# Patient Record
Sex: Female | Born: 1980 | Race: White | Hispanic: No | Marital: Single | Smoking: Current every day smoker
Health system: Southern US, Community
[De-identification: ages and names within clinical notes are randomized; demographics above are authoritative.]

## PROBLEM LIST (undated history)

## (undated) DIAGNOSIS — K289 Gastrojejunal ulcer, unspecified as acute or chronic, without hemorrhage or perforation: Secondary | ICD-10-CM

## (undated) HISTORY — PX: APPENDECTOMY: SHX54

## (undated) HISTORY — PX: CHOLECYSTECTOMY: SHX55

## (undated) HISTORY — PX: EXPLORATORY LAPAROTOMY: SUR591

## (undated) HISTORY — PX: ABDOMINAL SURGERY: SHX537

## (undated) HISTORY — PX: TUBAL LIGATION: SHX77

## (undated) HISTORY — PX: GASTRIC BYPASS: SHX52

---

## 2012-02-13 ENCOUNTER — Emergency Department: Payer: Self-pay | Admitting: Emergency Medicine

## 2012-02-13 LAB — COMPREHENSIVE METABOLIC PANEL
Albumin: 4.3 g/dL (ref 3.4–5.0)
Alkaline Phosphatase: 64 U/L (ref 50–136)
BUN: 8 mg/dL (ref 7–18)
Bilirubin,Total: 0.4 mg/dL (ref 0.2–1.0)
Calcium, Total: 9.2 mg/dL (ref 8.5–10.1)
Co2: 29 mmol/L (ref 21–32)
Creatinine: 0.61 mg/dL (ref 0.60–1.30)
EGFR (African American): >60
Glucose: 88 mg/dL (ref 65–99)
Osmolality: 275 (ref 275–301)
Potassium: 3.6 mmol/L (ref 3.5–5.1)
SGOT(AST): 34 U/L (ref 15–37)
SGPT (ALT): 28 U/L (ref 12–78)
Total Protein: 7.8 g/dL (ref 6.4–8.2)

## 2012-02-13 LAB — CBC
HCT: 37.6 % (ref 35.0–47.0)
MCHC: 33.5 g/dL (ref 32.0–36.0)
MCV: 91 fL (ref 80–100)
RDW: 15.8 % — ABNORMAL HIGH (ref 11.5–14.5)
WBC: 6.2 10*3/uL (ref 3.6–11.0)

## 2012-02-13 LAB — URINALYSIS, COMPLETE
Bacteria: NONE SEEN
Bilirubin,UR: NEGATIVE
Glucose,UR: NEGATIVE mg/dL (ref 0–75)
Leukocyte Esterase: NEGATIVE
Nitrite: NEGATIVE
RBC,UR: NONE SEEN /HPF (ref 0–5)
Specific Gravity: 1.004 (ref 1.003–1.030)
Squamous Epithelial: 2

## 2012-02-13 LAB — LIPASE, BLOOD: Lipase: 166 U/L (ref 73–393)

## 2012-06-22 ENCOUNTER — Ambulatory Visit: Payer: Self-pay | Admitting: Internal Medicine

## 2012-10-27 LAB — COMPREHENSIVE METABOLIC PANEL
Alkaline Phosphatase: 65 U/L (ref 50–136)
Anion Gap: 8 (ref 7–16)
BUN: 17 mg/dL (ref 7–18)
Bilirubin,Total: 0.2 mg/dL (ref 0.2–1.0)
Co2: 25 mmol/L (ref 21–32)
EGFR (African American): >60
EGFR (Non-African Amer.): >60
Glucose: 88 mg/dL (ref 65–99)
Osmolality: 277 (ref 275–301)
Potassium: 4.2 mmol/L (ref 3.5–5.1)
Sodium: 138 mmol/L (ref 136–145)
Total Protein: 7.5 g/dL (ref 6.4–8.2)

## 2012-10-27 LAB — URINALYSIS, COMPLETE
Bacteria: NONE SEEN
Glucose,UR: NEGATIVE mg/dL (ref 0–75)
Leukocyte Esterase: NEGATIVE
Nitrite: NEGATIVE
RBC,UR: 1 /HPF (ref 0–5)
Squamous Epithelial: 20
WBC UR: 3 /HPF (ref 0–5)

## 2012-10-27 LAB — CBC
HCT: 35.2 % (ref 35.0–47.0)
HGB: 11.6 g/dL — ABNORMAL LOW (ref 12.0–16.0)
MCH: 27.8 pg (ref 26.0–34.0)
MCV: 85 fL (ref 80–100)
Platelet: 410 10*3/uL (ref 150–440)
RBC: 4.16 10*6/uL (ref 3.80–5.20)
RDW: 16.9 % — ABNORMAL HIGH (ref 11.5–14.5)
WBC: 11.1 10*3/uL — ABNORMAL HIGH (ref 3.6–11.0)

## 2012-10-27 LAB — PREGNANCY, URINE: Pregnancy Test, Urine: NEGATIVE m[IU]/mL

## 2012-10-27 LAB — LIPASE, BLOOD: Lipase: 176 U/L (ref 73–393)

## 2012-10-28 ENCOUNTER — Inpatient Hospital Stay: Payer: Self-pay | Admitting: Surgery

## 2012-10-29 LAB — CBC WITH DIFFERENTIAL/PLATELET
Basophil #: 0.1 10*3/uL (ref 0.0–0.1)
Basophil %: 0.9 %
Eosinophil #: 0.3 10*3/uL (ref 0.0–0.7)
Eosinophil %: 5 %
HGB: 9.7 g/dL — ABNORMAL LOW (ref 12.0–16.0)
Lymphocyte #: 2.2 10*3/uL (ref 1.0–3.6)
MCH: 27.5 pg (ref 26.0–34.0)
MCV: 85 fL (ref 80–100)
Monocyte #: 0.3 x10 3/mm (ref 0.2–0.9)
Monocyte %: 4.9 %
Neutrophil #: 3.7 10*3/uL (ref 1.4–6.5)
Platelet: 310 10*3/uL (ref 150–440)
RBC: 3.55 10*6/uL — ABNORMAL LOW (ref 3.80–5.20)
RDW: 16.9 % — ABNORMAL HIGH (ref 11.5–14.5)
WBC: 6.6 10*3/uL (ref 3.6–11.0)

## 2012-10-29 LAB — COMPREHENSIVE METABOLIC PANEL
Alkaline Phosphatase: 51 U/L (ref 50–136)
Anion Gap: 6 — ABNORMAL LOW (ref 7–16)
Bilirubin,Total: 0.2 mg/dL (ref 0.2–1.0)
Glucose: 88 mg/dL (ref 65–99)
Osmolality: 280 (ref 275–301)
Potassium: 4.1 mmol/L (ref 3.5–5.1)
SGOT(AST): 11 U/L — ABNORMAL LOW (ref 15–37)
SGPT (ALT): 11 U/L — ABNORMAL LOW (ref 12–78)
Sodium: 142 mmol/L (ref 136–145)
Total Protein: 6 g/dL — ABNORMAL LOW (ref 6.4–8.2)

## 2012-10-29 LAB — MAGNESIUM: Magnesium: 1.6 mg/dL — ABNORMAL LOW

## 2012-10-30 LAB — PATHOLOGY REPORT

## 2012-10-31 LAB — BASIC METABOLIC PANEL
Anion Gap: 5 — ABNORMAL LOW (ref 7–16)
BUN: 3 mg/dL — ABNORMAL LOW (ref 7–18)
Calcium, Total: 8.1 mg/dL — ABNORMAL LOW (ref 8.5–10.1)
Co2: 27 mmol/L (ref 21–32)
Creatinine: 0.6 mg/dL (ref 0.60–1.30)
EGFR (African American): >60
Potassium: 3.9 mmol/L (ref 3.5–5.1)

## 2012-10-31 LAB — CBC WITH DIFFERENTIAL/PLATELET
Basophil #: 0 10*3/uL (ref 0.0–0.1)
Eosinophil #: 0.2 10*3/uL (ref 0.0–0.7)
Eosinophil %: 2.9 %
HCT: 30.8 % — ABNORMAL LOW (ref 35.0–47.0)
Lymphocyte #: 1.1 10*3/uL (ref 1.0–3.6)
MCH: 27.8 pg (ref 26.0–34.0)
MCHC: 32.8 g/dL (ref 32.0–36.0)
Neutrophil #: 4.9 10*3/uL (ref 1.4–6.5)
Neutrophil %: 74.5 %
RBC: 3.64 10*6/uL — ABNORMAL LOW (ref 3.80–5.20)
RDW: 16.7 % — ABNORMAL HIGH (ref 11.5–14.5)
WBC: 6.5 10*3/uL (ref 3.6–11.0)

## 2012-11-03 LAB — BASIC METABOLIC PANEL
Calcium, Total: 8.5 mg/dL (ref 8.5–10.1)
Chloride: 109 mmol/L — ABNORMAL HIGH (ref 98–107)
Co2: 27 mmol/L (ref 21–32)
Creatinine: 0.51 mg/dL — ABNORMAL LOW (ref 0.60–1.30)
EGFR (Non-African Amer.): >60
Glucose: 96 mg/dL (ref 65–99)
Osmolality: 279 (ref 275–301)
Potassium: 3.2 mmol/L — ABNORMAL LOW (ref 3.5–5.1)
Sodium: 142 mmol/L (ref 136–145)

## 2012-11-03 LAB — CBC WITH DIFFERENTIAL/PLATELET
Basophil #: 0 10*3/uL (ref 0.0–0.1)
Basophil %: 0.6 %
Eosinophil #: 0.3 10*3/uL (ref 0.0–0.7)
Eosinophil %: 5.8 %
Lymphocyte #: 0.9 10*3/uL — ABNORMAL LOW (ref 1.0–3.6)
Lymphocyte %: 20.3 %
MCH: 27.7 pg (ref 26.0–34.0)
MCHC: 32.8 g/dL (ref 32.0–36.0)
MCV: 85 fL (ref 80–100)
Monocyte #: 0.4 x10 3/mm (ref 0.2–0.9)
Monocyte %: 8.9 %
Neutrophil #: 2.9 10*3/uL (ref 1.4–6.5)
Neutrophil %: 64.4 %
Platelet: 274 10*3/uL (ref 150–440)
RBC: 3.4 10*6/uL — ABNORMAL LOW (ref 3.80–5.20)
WBC: 4.4 10*3/uL (ref 3.6–11.0)

## 2012-11-07 LAB — URINALYSIS, COMPLETE
Bilirubin,UR: NEGATIVE
Ketone: NEGATIVE
Nitrite: NEGATIVE
Ph: 6 (ref 4.5–8.0)
RBC,UR: 1 /HPF (ref 0–5)
Squamous Epithelial: 2
WBC UR: 1 /HPF (ref 0–5)

## 2012-11-07 LAB — CBC WITH DIFFERENTIAL/PLATELET
Basophil #: 0 10*3/uL (ref 0.0–0.1)
Basophil %: 0.4 %
Eosinophil %: 5 %
HCT: 29.7 % — ABNORMAL LOW (ref 35.0–47.0)
Lymphocyte #: 1.5 10*3/uL (ref 1.0–3.6)
Lymphocyte %: 24.5 %
MCHC: 32.5 g/dL (ref 32.0–36.0)
Monocyte #: 0.5 x10 3/mm (ref 0.2–0.9)
Monocyte %: 8.4 %
Neutrophil #: 3.7 10*3/uL (ref 1.4–6.5)
Neutrophil %: 61.7 %
Platelet: 341 10*3/uL (ref 150–440)
RDW: 17.8 % — ABNORMAL HIGH (ref 11.5–14.5)
WBC: 6 10*3/uL (ref 3.6–11.0)

## 2012-11-07 LAB — BASIC METABOLIC PANEL
BUN: 4 mg/dL — ABNORMAL LOW (ref 7–18)
Calcium, Total: 9.2 mg/dL (ref 8.5–10.1)
Chloride: 101 mmol/L (ref 98–107)
Co2: 32 mmol/L (ref 21–32)
Creatinine: 0.56 mg/dL — ABNORMAL LOW (ref 0.60–1.30)
EGFR (African American): >60
EGFR (Non-African Amer.): >60
Glucose: 97 mg/dL (ref 65–99)
Osmolality: 271 (ref 275–301)
Potassium: 3.8 mmol/L (ref 3.5–5.1)

## 2012-11-10 LAB — CLOSTRIDIUM DIFFICILE BY PCR

## 2012-11-11 LAB — CREATININE, SERUM
EGFR (African American): >60
EGFR (Non-African Amer.): >60

## 2012-11-15 LAB — CREATININE, SERUM: Creatinine: 0.56 mg/dL — ABNORMAL LOW (ref 0.60–1.30)

## 2012-11-18 ENCOUNTER — Observation Stay: Payer: Self-pay | Admitting: Surgery

## 2012-12-16 ENCOUNTER — Ambulatory Visit: Payer: Self-pay | Admitting: Surgery

## 2012-12-21 ENCOUNTER — Other Ambulatory Visit: Payer: Self-pay | Admitting: Surgery

## 2012-12-21 LAB — COMPREHENSIVE METABOLIC PANEL
Albumin: 3.8 g/dL (ref 3.4–5.0)
Anion Gap: 6 — ABNORMAL LOW (ref 7–16)
Bilirubin,Total: 0.3 mg/dL (ref 0.2–1.0)
Chloride: 111 mmol/L — ABNORMAL HIGH (ref 98–107)
Co2: 26 mmol/L (ref 21–32)
Creatinine: 0.67 mg/dL (ref 0.60–1.30)
EGFR (African American): >60
Glucose: 84 mg/dL (ref 65–99)
Osmolality: 284 (ref 275–301)
Potassium: 3.2 mmol/L — ABNORMAL LOW (ref 3.5–5.1)
SGOT(AST): 12 U/L — ABNORMAL LOW (ref 15–37)
SGPT (ALT): 17 U/L (ref 12–78)
Sodium: 143 mmol/L (ref 136–145)
Total Protein: 6.7 g/dL (ref 6.4–8.2)

## 2012-12-21 LAB — CBC WITH DIFFERENTIAL/PLATELET
Basophil #: 0 10*3/uL (ref 0.0–0.1)
Basophil %: 0.8 %
Eosinophil #: 0.2 10*3/uL (ref 0.0–0.7)
Eosinophil %: 3.9 %
HCT: 32.2 % — ABNORMAL LOW (ref 35.0–47.0)
Lymphocyte #: 1.7 10*3/uL (ref 1.0–3.6)
Lymphocyte %: 32.8 %
MCH: 25.7 pg — ABNORMAL LOW (ref 26.0–34.0)
MCHC: 32.5 g/dL (ref 32.0–36.0)
MCV: 79 fL — ABNORMAL LOW (ref 80–100)
Monocyte #: 0.3 x10 3/mm (ref 0.2–0.9)
WBC: 5.3 10*3/uL (ref 3.6–11.0)

## 2012-12-21 LAB — PROTIME-INR: Prothrombin Time: 11.4 secs — ABNORMAL LOW (ref 11.5–14.7)

## 2013-01-04 ENCOUNTER — Emergency Department: Payer: Self-pay | Admitting: Emergency Medicine

## 2013-01-04 LAB — COMPREHENSIVE METABOLIC PANEL
Albumin: 3.3 g/dL — ABNORMAL LOW (ref 3.4–5.0)
Alkaline Phosphatase: 64 U/L (ref 50–136)
Anion Gap: 9 (ref 7–16)
BUN: 4 mg/dL — ABNORMAL LOW (ref 7–18)
Bilirubin,Total: 0.3 mg/dL (ref 0.2–1.0)
Chloride: 110 mmol/L — ABNORMAL HIGH (ref 98–107)
Creatinine: 0.66 mg/dL (ref 0.60–1.30)
EGFR (African American): >60
EGFR (Non-African Amer.): >60
SGOT(AST): 20 U/L (ref 15–37)
SGPT (ALT): 23 U/L (ref 12–78)
Sodium: 143 mmol/L (ref 136–145)
Total Protein: 6.3 g/dL — ABNORMAL LOW (ref 6.4–8.2)

## 2013-01-04 LAB — URINALYSIS, COMPLETE
Blood: NEGATIVE
Glucose,UR: NEGATIVE mg/dL (ref 0–75)
Ketone: NEGATIVE
Leukocyte Esterase: NEGATIVE
Nitrite: NEGATIVE
Ph: 6 (ref 4.5–8.0)
Specific Gravity: 1.003 (ref 1.003–1.030)
Squamous Epithelial: <1

## 2013-01-04 LAB — BASIC METABOLIC PANEL
Anion Gap: 8 (ref 7–16)
BUN: 4 mg/dL — ABNORMAL LOW (ref 7–18)
Co2: 23 mmol/L (ref 21–32)
Creatinine: 0.64 mg/dL (ref 0.60–1.30)
EGFR (African American): >60
EGFR (Non-African Amer.): >60
Glucose: 38 mg/dL — CL (ref 65–99)

## 2013-01-04 LAB — LIPASE, BLOOD: Lipase: 242 U/L (ref 73–393)

## 2013-01-04 LAB — CBC
HCT: 30.1 % — ABNORMAL LOW (ref 35.0–47.0)
MCH: 25.1 pg — ABNORMAL LOW (ref 26.0–34.0)
MCHC: 32.6 g/dL (ref 32.0–36.0)
Platelet: 443 10*3/uL — ABNORMAL HIGH (ref 150–440)
RBC: 3.91 10*6/uL (ref 3.80–5.20)
RDW: 16.9 % — ABNORMAL HIGH (ref 11.5–14.5)

## 2013-01-04 LAB — TROPONIN I: Troponin-I: <0.02 ng/mL

## 2013-01-04 LAB — HCG, QUANTITATIVE, PREGNANCY: Beta Hcg, Quant.: <1 m[IU]/mL — ABNORMAL LOW

## 2013-03-02 ENCOUNTER — Emergency Department: Payer: Self-pay | Admitting: Emergency Medicine

## 2013-03-02 LAB — URINALYSIS, COMPLETE
Bilirubin,UR: NEGATIVE
Blood: NEGATIVE
Glucose,UR: NEGATIVE mg/dL (ref 0–75)
Hyaline Cast: 4
Nitrite: NEGATIVE
RBC,UR: 2 /HPF (ref 0–5)
Specific Gravity: 1.024 (ref 1.003–1.030)
Squamous Epithelial: 3

## 2013-03-02 LAB — COMPREHENSIVE METABOLIC PANEL
Albumin: 4.1 g/dL (ref 3.4–5.0)
Alkaline Phosphatase: 75 U/L (ref 50–136)
Anion Gap: 6 — ABNORMAL LOW (ref 7–16)
BUN: 11 mg/dL (ref 7–18)
Bilirubin,Total: 0.5 mg/dL (ref 0.2–1.0)
Calcium, Total: 9.2 mg/dL (ref 8.5–10.1)
Co2: 25 mmol/L (ref 21–32)
Glucose: 87 mg/dL (ref 65–99)
SGOT(AST): 26 U/L (ref 15–37)
SGPT (ALT): 30 U/L (ref 12–78)
Total Protein: 7.6 g/dL (ref 6.4–8.2)

## 2013-03-02 LAB — CBC
HCT: 39.5 % (ref 35.0–47.0)
HGB: 13.1 g/dL (ref 12.0–16.0)
MCH: 29 pg (ref 26.0–34.0)
Platelet: 247 10*3/uL (ref 150–440)
RDW: 25.4 % — ABNORMAL HIGH (ref 11.5–14.5)
WBC: 7.5 10*3/uL (ref 3.6–11.0)

## 2013-06-20 ENCOUNTER — Inpatient Hospital Stay: Payer: Self-pay | Admitting: Internal Medicine

## 2013-06-20 LAB — LIPASE, BLOOD: Lipase: 158 U/L (ref 73–393)

## 2013-06-20 LAB — DRUG SCREEN, URINE
Amphetamines, Ur Screen: NEGATIVE (ref ?–1000)
Benzodiazepine, Ur Scrn: NEGATIVE (ref ?–200)
Cannabinoid 50 Ng, Ur ~~LOC~~: NEGATIVE (ref ?–50)
MDMA (Ecstasy)Ur Screen: NEGATIVE (ref ?–500)
Methadone, Ur Screen: NEGATIVE (ref ?–300)
Opiate, Ur Screen: NEGATIVE (ref ?–300)
Phencyclidine (PCP) Ur S: NEGATIVE (ref ?–25)
Tricyclic, Ur Screen: NEGATIVE (ref ?–1000)

## 2013-06-20 LAB — CBC
HCT: 37.5 % (ref 35.0–47.0)
HGB: 11.8 g/dL — ABNORMAL LOW (ref 12.0–16.0)
MCH: 29.6 pg (ref 26.0–34.0)
MCHC: 31.5 g/dL — ABNORMAL LOW (ref 32.0–36.0)
MCV: 94 fL (ref 80–100)
Platelet: 242 10*3/uL (ref 150–440)
RDW: 14.1 % (ref 11.5–14.5)

## 2013-06-20 LAB — URINALYSIS, COMPLETE
Blood: NEGATIVE
Ketone: NEGATIVE
Leukocyte Esterase: NEGATIVE
RBC,UR: <1 /HPF (ref 0–5)
Specific Gravity: 1.009 (ref 1.003–1.030)
Squamous Epithelial: 1
WBC UR: <1 /HPF (ref 0–5)

## 2013-06-20 LAB — COMPREHENSIVE METABOLIC PANEL
Albumin: 4 g/dL (ref 3.4–5.0)
Alkaline Phosphatase: 67 U/L
Anion Gap: 6 — ABNORMAL LOW (ref 7–16)
Bilirubin,Total: 0.3 mg/dL (ref 0.2–1.0)
Calcium, Total: 9.2 mg/dL (ref 8.5–10.1)
Chloride: 107 mmol/L (ref 98–107)
EGFR (African American): >60
Glucose: 80 mg/dL (ref 65–99)
SGOT(AST): 20 U/L (ref 15–37)
Sodium: 140 mmol/L (ref 136–145)
Total Protein: 7.3 g/dL (ref 6.4–8.2)

## 2013-06-21 LAB — CBC WITH DIFFERENTIAL/PLATELET
Basophil #: 0 10*3/uL (ref 0.0–0.1)
Basophil %: 1.1 %
Eosinophil %: 3.5 %
HCT: 28.1 % — ABNORMAL LOW (ref 35.0–47.0)
Lymphocyte #: 1.9 10*3/uL (ref 1.0–3.6)
Lymphocyte %: 44.9 %
MCHC: 32.4 g/dL (ref 32.0–36.0)
Monocyte #: 0.3 x10 3/mm (ref 0.2–0.9)
Monocyte %: 8.3 %
Neutrophil #: 1.8 10*3/uL (ref 1.4–6.5)
Neutrophil %: 42.2 %
Platelet: 156 10*3/uL (ref 150–440)
WBC: 4.2 10*3/uL (ref 3.6–11.0)

## 2013-06-21 LAB — BASIC METABOLIC PANEL
Anion Gap: 5 — ABNORMAL LOW (ref 7–16)
BUN: 7 mg/dL (ref 7–18)
Calcium, Total: 8.1 mg/dL — ABNORMAL LOW (ref 8.5–10.1)
EGFR (African American): >60
EGFR (Non-African Amer.): >60
Osmolality: 278 (ref 275–301)
Potassium: 3.4 mmol/L — ABNORMAL LOW (ref 3.5–5.1)
Sodium: 141 mmol/L (ref 136–145)

## 2013-06-21 LAB — MAGNESIUM: Magnesium: 1.5 mg/dL — ABNORMAL LOW

## 2013-08-16 ENCOUNTER — Ambulatory Visit: Payer: Self-pay | Admitting: Surgery

## 2013-08-16 LAB — HEMOGLOBIN: HGB: 11.5 g/dL — ABNORMAL LOW (ref 12.0–16.0)

## 2013-08-16 LAB — POTASSIUM: POTASSIUM: 3.4 mmol/L — AB (ref 3.5–5.1)

## 2014-09-06 IMAGING — RF DG UGI W/ KUB
14 of 15 series · 14 of 24 positions shown · non-contrast
Comparison: none

REASON FOR EXAM: redo grastrojejunostomy of RYGBP 10/30/12: Call [id number] with
results / issues.
COMMENTS:

[Series 1: fluoro_barium 2fps_bw · 0.17mm/px · 1 of 1 slices shown (1 of 14)]
[im 1/1]
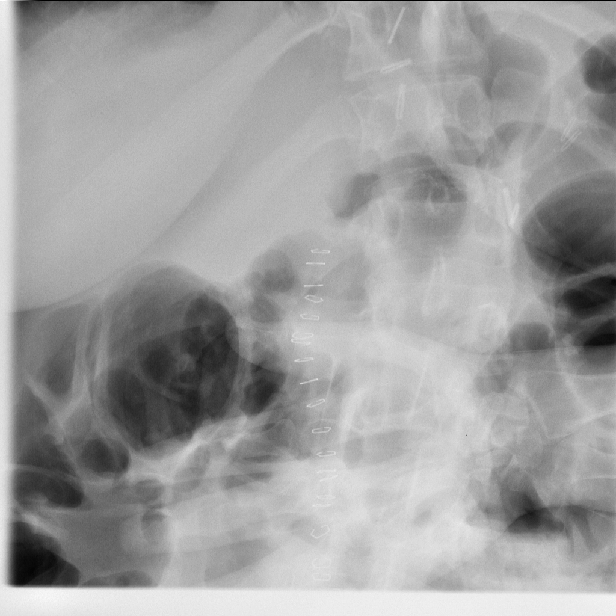

[Series 3: fluoro_barium 2fps_bw · 0.17mm/px · 1 of 12 frames shown (2 of 14)]
[frame 1/12]
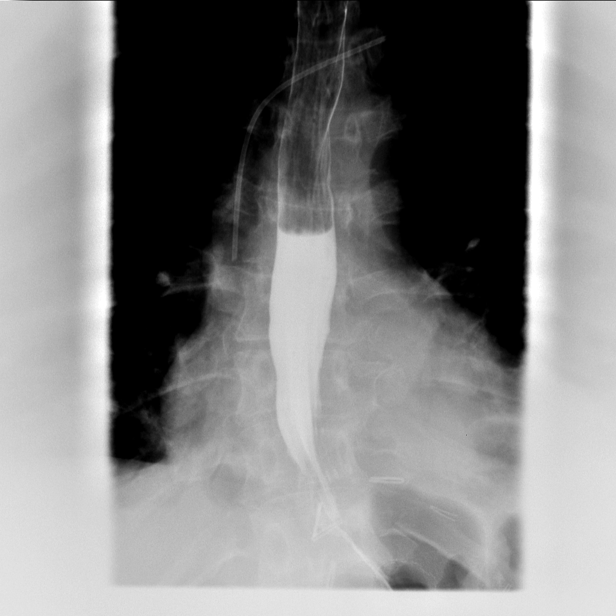

[Series 4: fluoro_barium 2fps_bw · 0.17mm/px · 1 of 8 frames shown (3 of 14)]
[frame 5/8]
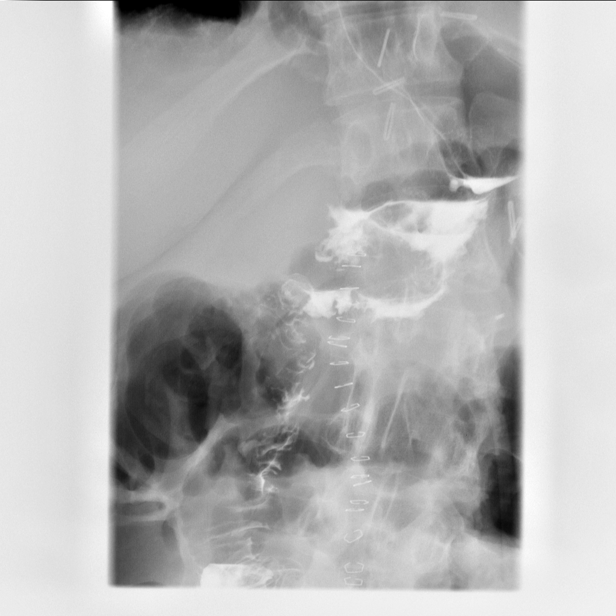

[Series 5: fluoro_barium 2fps_bw · 0.17mm/px · 1 of 7 frames shown (4 of 14)]
[frame 4/7]
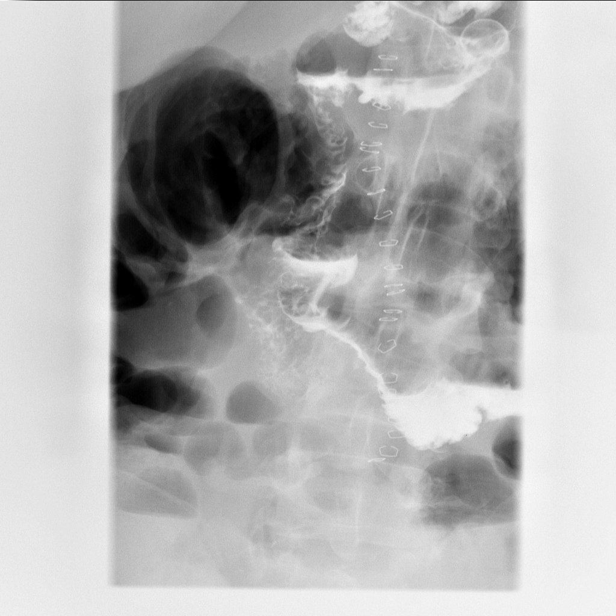

[Series 6: fluoro_barium 2fps_bw · 0.17mm/px · 1 of 20 frames shown (5 of 14)]
[frame 4/20]
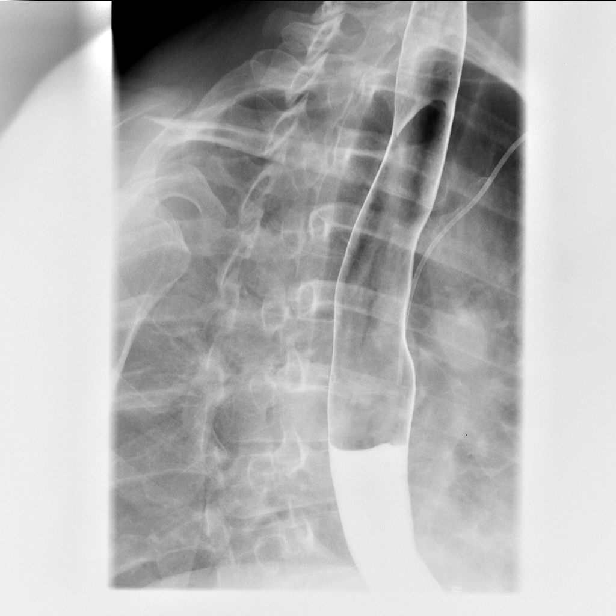

[Series 7: fluoro_barium 2fps_bw · 0.17mm/px · 1 of 7 frames shown (6 of 14)]
[frame 1/7]
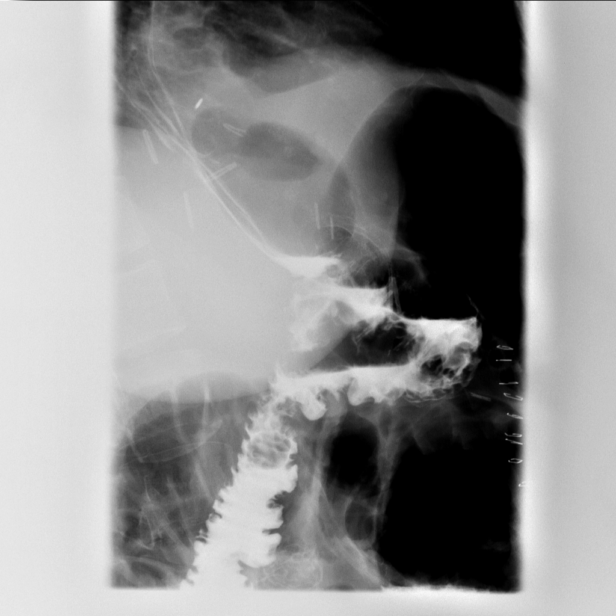

[Series 8: fluoro_barium 2fps_bw · 0.18mm/px · 1 of 20 frames shown (7 of 14)]
[frame 4/20]
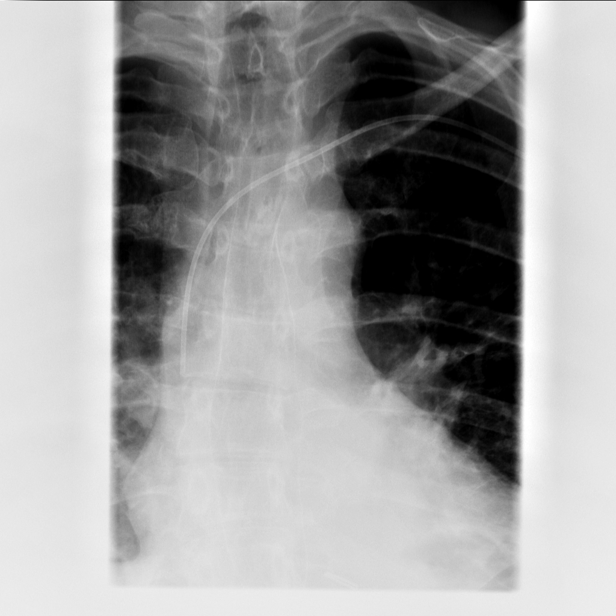

[Series 9: fluoro_barium 2fps_bw · 0.18mm/px · 1 of 20 frames shown (8 of 14)]
[frame 4/20]
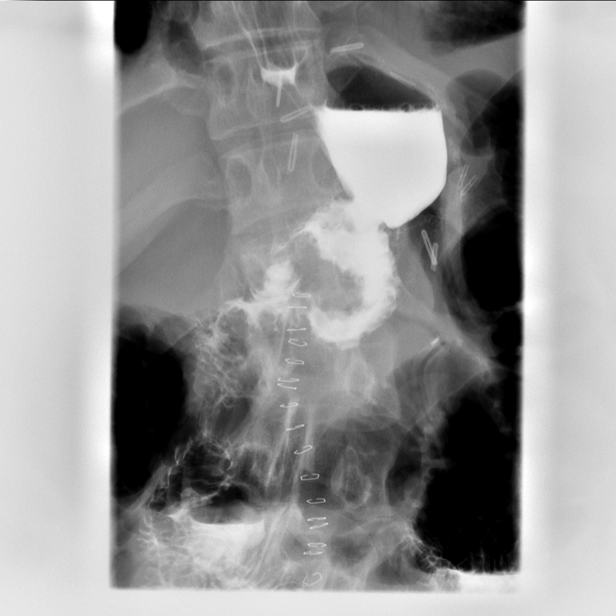

[Series 10: fluoro_barium 2fps_bw · 0.18mm/px · 1 of 10 frames shown (9 of 14)]
[frame 1/10]
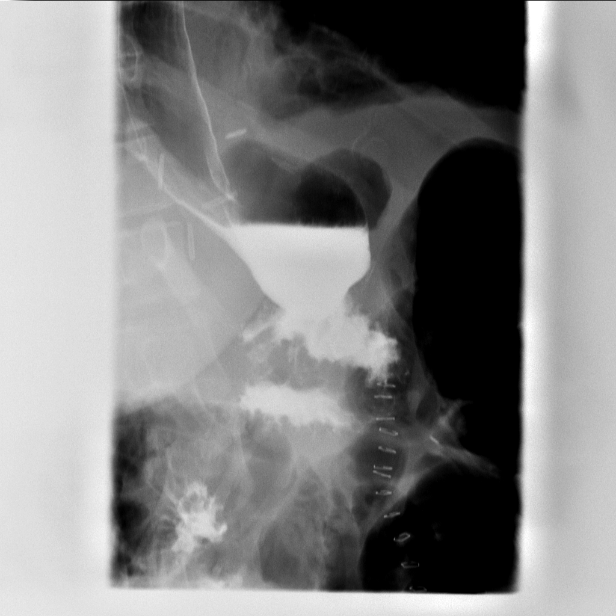

[Series 11: fluoro_barium 2fps_bw · 0.17mm/px · 1 of 5 frames shown (10 of 14)]
[frame 1/5]
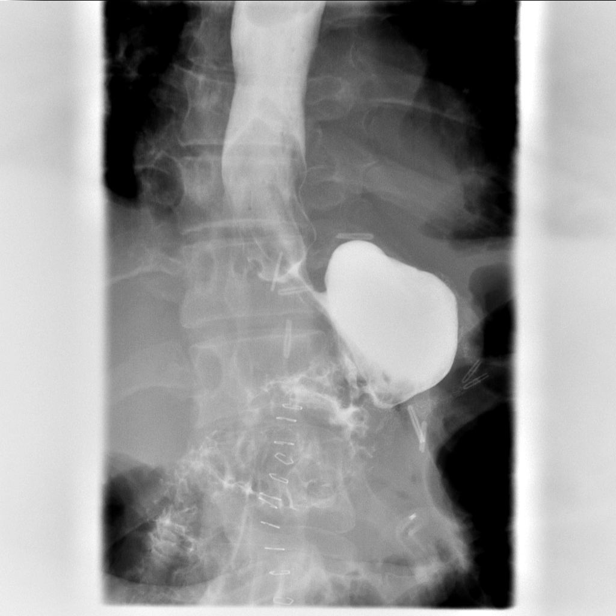

[Series 12: fluoro_barium 2fps_bw · 0.19mm/px · 1 of 8 frames shown (11 of 14)]
[frame 5/8]
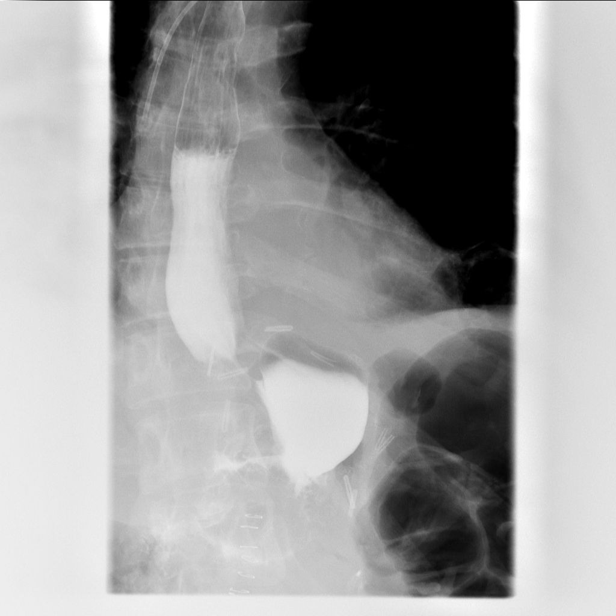

[Series 13: fluoro_barium 2fps_bw · 0.20mm/px · 1 of 13 frames shown (12 of 14)]
[frame 2/13]
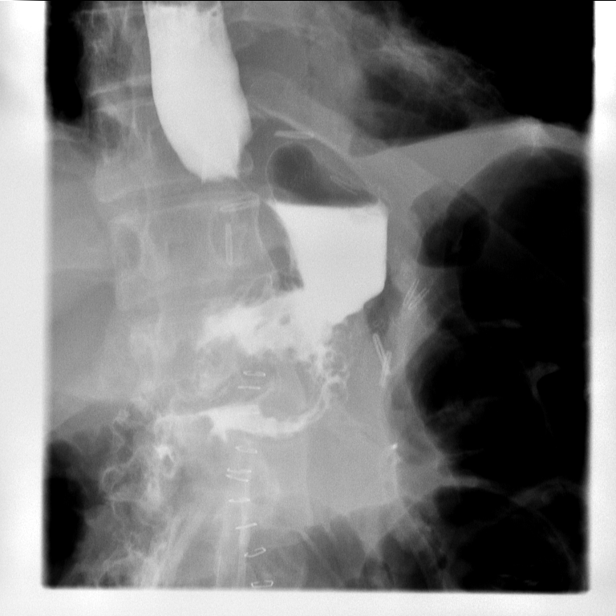

[Series 14: fluoro_barium 2fps_bw · 0.20mm/px · 1 of 5 frames shown (13 of 14)]
[frame 5/5]
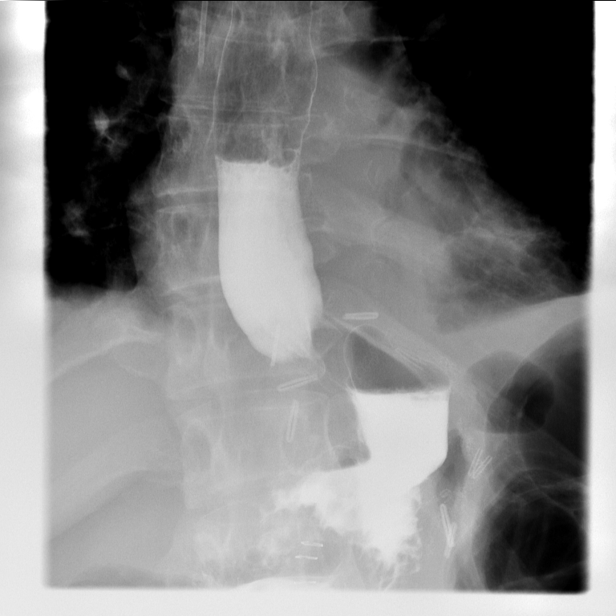

[Series 15: fluoro_barium 2fps_bw · 0.20mm/px · 1 of 17 frames shown (14 of 14)]
[frame 15/17]
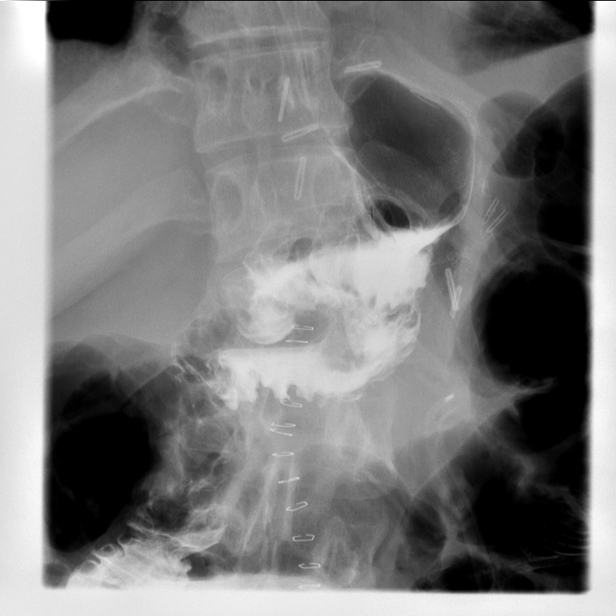

[14 of 24 positions shown; findings below may reference images not displayed]

PROCEDURE:     FL  - FL UPPER GI W/ BARIUM SWALLOW  - November 03, 2012  [DATE]

RESULT:     Limited single contrast upper GI examination is performed
following revision of a gastrojejunostomy. Gastrografin was used initially
and showed no definite leak. Subsequently thin barium was utilized. There
multiple distended loops of small bowel present. Gastrografin barium flowed
from the esophagus into the gastric pouch and quickly into the small bowel
without evidence of obstruction or anastomotic leak. There is evidence of
gastroesophageal reflux when the patient was placed in recumbent position
following the initial upright acquisition of images. No gas producing
crystals were administered. Post procedure film show contrast was traveled
into the distended small bowel loops.
IMPRESSION: 1. No evidence of anastomotic leak or other acute abnormality. A little
distended loops of small bowel with air-fluid levels are demonstrated.
Followup image would be recommended later on 03 November, 2012 possibly around [DATE]
p.m. to evaluate passage of contrast into the colon to ensure absence of
obstruction.

[REDACTED]

## 2014-10-21 NOTE — Consult Note (Signed)
Chief Complaint:  Subjective/Chief Complaint Fair amount of pain post surgery. No flatus or BM yet.   VITAL SIGNS/ANCILLARY NOTES: **Vital Signs.:   03-May-14 04:42  Vital Signs Type Routine  Temperature Temperature (F) 98.7  Celsius 37  Temperature Source oral  Pulse Pulse 75  Respirations Respirations 18  Systolic BP Systolic BP 789  Diastolic BP (mmHg) Diastolic BP (mmHg) 81  Mean BP 95  Pulse Ox % Pulse Ox % 99  Pulse Ox Activity Level  At rest  Oxygen Delivery 2L   Brief Assessment:  Cardiac Regular   Respiratory clear BS   Gastrointestinal diffusely tender. NG in place   Lab Results: Routine Chem:  03-May-14 04:10   Glucose, Serum  149  BUN  3  Creatinine (comp) 0.60  Sodium, Serum 143  Potassium, Serum 3.9  Chloride, Serum  111  CO2, Serum 27  Calcium (Total), Serum  8.1  Anion Gap  5  Osmolality (calc) 284  eGFR (African American) >60  eGFR (Non-African American) >60 (eGFR values <41mL/min/1.73 m2 may be an indication of chronic kidney disease (CKD). Calculated eGFR is useful in patients with stable renal function. The eGFR calculation will not be reliable in acutely ill patients when serum creatinine is changing rapidly. It is not useful in  patients on dialysis. The eGFR calculation may not be applicable to patients at the low and high extremes of body sizes, pregnant women, and vegetarians.)  Routine Hem:  03-May-14 04:10   WBC (CBC) 6.5  RBC (CBC)  3.64  Hemoglobin (CBC)  10.1  Hematocrit (CBC)  30.8  Platelet Count (CBC) 225  MCV 85  MCH 27.8  MCHC 32.8  RDW  16.7  Neutrophil % 74.5  Lymphocyte % 16.3  Monocyte % 5.7  Eosinophil % 2.9  Basophil % 0.6  Neutrophil # 4.9  Lymphocyte # 1.1  Monocyte # 0.4  Eosinophil # 0.2  Basophil # 0.0 (Result(s) reported on 31 Oct 2012 at 05:13AM.)   Assessment/Plan:  Assessment/Plan:  Assessment Recurrent anastomotic ulcer plus stricture. S/P gastric surgery.   Plan Agree that patient no  longer needs PPI/carafate post vagotomy. Pt to follow surgery recommendations. Nothing more to add. Will sign off. Thanks.   Electronic Signatures: Verdie Shire (MD)  (Signed 03-May-14 10:04)  Authored: Chief Complaint, VITAL SIGNS/ANCILLARY NOTES, Brief Assessment, Lab Results, Assessment/Plan   Last Updated: 03-May-14 10:04 by Verdie Shire (MD)

## 2014-10-21 NOTE — Consult Note (Signed)
Abd less distended with NG suction but still painful. Has large blister on her right breast that will need to be drained. EGD showed anastomotic stricture and large ulcer belowat the anastomosis. Afferent limb small but dilated. Efferent limb normal. Anastomotic ulcer bx's taken and anastomosis dilated to 15mm. Start clear liquid diet. Protonix bid and carafate qid. Start clear liquid diet. Will see how she does.   Electronic Signatures: Lutricia Feilh, Rodriques Badie (MD) (Signed on 01-May-14 11:46)  Authored   Last Updated: 01-May-14 11:52 by Lutricia Feilh, Braiden Presutti (MD)

## 2014-10-21 NOTE — Consult Note (Signed)
Brief Consult Note: Diagnosis: Admitted for small bowel obstruction.  Persistent intermittent nausea and vomiting.  Significant multiple abdominal surgeries.  Chronic abdominal pain.   Consult note dictated.   Discussed with Attending MD.   Comments: Patient's presentation discussed with Dr. Lutricia FeilPaul Oh.  Will proceed forward with EGD tomorrow to allow direct luminal evaluation of upper GI tract.  Concern for possible obstructive process.  Order placed.  Remain NPO.  NG tub to remain in place to low intermittent suction.  Continue to monitor hemodynamic status and pain management.  Once IV access reestablished protonix to be resumed.  Once patient's condition is stablized and N/V has been improved she does warrant proceeding forward with diagnostic colonoscopy due to noted change in bowel habits over the past two years.  Family history of IBD.  Increase risk for patient.  Electronic Signatures: Rodman KeyHarrison, Jasdeep Dejarnett S (NP)  (Signed 30-Apr-14 15:03)  Authored: Brief Consult Note   Last Updated: 30-Apr-14 15:03 by Rodman KeyHarrison, Abu Heavin S (NP)

## 2014-10-21 NOTE — Discharge Summary (Signed)
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   PRINCIPAL PROCEDURE PERFORMED: On 10/30/2012, revision of gastrojejunostomy with partial gastrectomy and partial intestinal resection and reconstruction with truncal vagotomy, Heineke-Mikulicz pyloroplasty and open gastrostomy tube placement.   HOSPITAL COURSE: [name] was found to have the above-mentioned ulcer on upper endoscopy the day before surgery, and her CT scan with oral contrast was very confusing as she had multiple loops of dilated bowel but did not have contrast within them, and the contrast flowed quickly through her functional GI tract to her colon within 4 hours. It was thought that she possibly had dumping syndrome in addition to the above-mentioned problem, and she was also status post repair of  an anastomotic perforation (by Cheree DittoGraham patch) in [place] 2013 [place] Other past medical history includes fibromyalgia. Postoperatively, the patient did well and ultimately had her nasogastric tube removed and underwent a contrast study which showed a wide-open anastomosis with no leakage, and then her diet was advanced to a post gastric bypass diet, but her gastrostomy tube was left open to gravity drainage as it continued to put out a fair amount of fluid. On postoperative day 2, over 48 hours after she had been off all acid-reducing medications, I checked a pH of her remnant stomach and it was 5, and the pH of her functional small stomach pouch was 7. The patient required very high doses of opiate analgesics and was discharged home on 200 mg of morphine extended release by mouth every 12 hours scheduled as well as morphine immediate release 30 mg by mouth every 2 hours p.r.n. In addition to this,  she was taking Ambien 10 mg at bedtime, acetaminophen/butalbital/caffeine 325/50/40 q. 4 hours p.r.n. headache, scopolamine 1 patch every 72 hours transdermal, alprazolam 0.5 mg q. 6 h. p.r.n. anxiety and promethazine 12.5 mg q. 6 h. p.r.n. nausea. She was also using a 10 mg nicotine inhalation device.       She was asked to make an appointment to see me the following week after discharge and to call the office in the interim for any problems.  ____________________________ Claude MangesWilliam F. Dayshawn Irizarry, MD wfm:cb D: 12/03/2012 17:17:12 ET T: 12/03/2012 22:12:25 ET JOB#: [id number]  cc: Claude MangesWilliam F. Inita Uram, MD, <Dictator> Claude MangesWILLIAM F Kaisy Severino MD ELECTRONICALLY SIGNED 12/04/2012 10:45

## 2014-10-21 NOTE — Consult Note (Signed)
Pt seen and examined. Please see Melanie Davidson's notes. Admitted with signif abd pain/distension and poss pSBO from multiple abd series. Currently has no IV access. Will proceed with EGD tomorrow once IV access opened. Will eventually need colonoscopy later due to family hx of colon cancer and Crohn's disease once patient can tolerate bowel prep. Will follow. Thanks.  Electronic Signatures: Lutricia Feilh, Elener Custodio (MD)  (Signed on 30-Apr-14 14:59)  Authored  Last Updated: 30-Apr-14 14:59 by Lutricia Feilh, Zoejane Gaulin (MD)

## 2014-10-21 NOTE — Consult Note (Signed)
Brief Consult Note: Diagnosis: ileus, constipation, abd pain.   Patient was seen by consultant.   Recommend further assessment or treatment.   Discussed with Attending MD.   Comments: agree with CT. will reasess.  Electronic Signatures: Lattie Hawooper, Kazuo Durnil E (MD)  (Signed 02-Sep-14 21:13)  Authored: Brief Consult Note   Last Updated: 02-Sep-14 21:13 by Lattie Hawooper, Gil Ingwersen E (MD)

## 2014-10-21 NOTE — Op Note (Signed)
PATIENT NAME:  Melanie, Davidson MR#:  [id number] DATE OF BIRTH:  1981-03-02  DATE OF PROCEDURE:  10/30/2012  OPERATION PERFORMED: 1. Revision of gastrojejunostomy, with partial gastrectomy and partial intestinal resection and with reconstruction, with truncal vagotomy.  2.  Heineke-Mikulicz pyloroplasty.  3.  Open gastrostomy tube placement (18-French in excluded stomach).  PREOPERATIVE DIAGNOSIS:  Large anastomotic (gastrojejunal) ulcer following Roux-en-Y gastric bypass with tight anastomotic stricture, possible dumping syndrome.  POSTOPERATIVE DIAGNOSIS:   Large anastomotic (gastrojejunal) ulcer following Roux-en-Y gastric bypass with tight anastomotic stricture, possible dumping syndrome.  SURGEON:  Claude Manges, M.D.  FIRST ASSISTANT:  Natale Lay, M.D.   ANESTHESIA:  General.   PROCEDURE IN DETAIL: The patient was placed supine on the operating room table and prepped and draped in the usual sterile fashion. An incision was made to the previous midline incision and this was carried up to the xiphoid process. The subcutaneous tissue and linea alba were opened and the peritoneum was entered carefully. Adhesions were taken down from the omentum to the anterior abdominal wall and subsequently from the visceral surface of the liver to the duodenum and defunctionalized stomach. Additional adhesions were taken down and there were 2 bandlike adhesions, one of which was short and firm and the other of which was long, skinny and firm and both of these were excised. It was thought possibly some of the patient's symptoms were due to these adhesions. It was never ascertained during the operation how all of the bile and gas got in the defunctionalized stomach or afferent limb. The patient's Roux-en-Y gastric bypass had been performed in an antecolic fashion with a reasonable length Roux-en-Y limb that was probably about 50 or 60 cm. The enteroenterostomy had been performed in the second loop of  jejunum past the ligament of Treitz. The enteroenterostomy looked entirely patent and there was no evidence of obstruction of either the afferent limb or the Roux limb at that level. In fact, the afferent limb and the duodenum itself appeared nondilated at surgery, although the defunctionalized stomach was quite dilated and filled with bile. The patient had a very firm, hard anastomotic area at the gastrojejunal anastomosis and this was ascertained after removing the adhesions between the small lesser curve stomach pouch and the defunctionalized or excluded stomach. In addition, there was a patch of omentum over top of this anastomosis and this was thought due to the patient's previous surgery where she was noted to have had a perforated ulcer. There was a single silk suture found in the bulb of the duodenum but there were no other indications near the duodenum that the patient had perforated a duodenal ulcer, as she had said.  After all of the adhesions were taken down, it was elected to proceed with a truncal vagotomy and therefore the intra-abdominal esophagus was encircled and both the anterior and posterior main vagus nerve trunks were partially excised amidst large hemoclips and sent for frozen section which confirmed nervous tissue in both. In addition, there were a few very small nervous elements in the area of the criminal nerve of Gretta Arab, which were divided with the electrocautery. Because of the large defunctionalized or excluded stomach, it was elected to proceed with a Heineke-Mikulicz pyloroplasty and therefore the anterior aspect of the prepyloric stomach and very first part of the duodenum were opened longitudinally and a Heineke-Mikulicz type pyloroplasty was performed with 2 layers with the inner layer being full thickness 3-0 Monocryl and the outer layer being seromuscular Lembert 2-0 silk  sutures. This was performed in a diamond-like orientation to maintain the pyloroplasty patency.  Then  attention was turned to the anastomosis and initially the anastomosis was opened and a very large (15 mm at least) ulcer was found and the lumen would barely admit the 18-French nasogastric tube and therefore was thought to only be about 8 mm in diameter. Ultimately, all of the ulcerated tissue of the jejunum was excised leaving the small opening in the stomach and later the small opening in the stomach was enlarged to admit a 25 mm EEA anvil so that the gastrojejunostomy could be revised. This was done by placing the EEA stapler through the enterotomy (which was approximately 5 to 10 cm distal to the staple line in the Roux limb) and the anvil was brought out the anterior wall of the original stomach pouch, which was approximately the size of a Grade. A large egg. After the gastrojejunostomy anastomosis was performed with a 25 mm EEA stapler, the resultant distal stomach and gastrotomy was closed with a single firing of TA-90 heavy wire stapler. Thus the gastric portion of the anastomosis was excised as well. This left a large defect in the jejunum approximately 5 to 10 cm distal to the gastrojejunostomy and this was closed in a transverse orientation with a running full-thickness suture of 3-0 Monocryl and then interrupted 3-0 silk seromuscular Lembert sutures such that it was widely patent. The jejunal wall here was normal and not scarred or inflamed and had an adequate blood supply and since the anastomosis was performed in a transverse orientation, it was widely patent. The 18-French [place] Sump nasogastric tube was positioned so that just a few holes were present distal to the gastrojejunostomy but most of the holes in the tube were present proximal to the anastomosis and the tip of the tube did not extend to the level of the enterotomy closure. I then placed an 18-French gastrostomy tube through the abdominal wall and the left upper quadrant and into the anterior wall of the defunctionalized or excluded  stomach amidst a pursestring suture of 2-0 silk.  Three other seromuscular Lembert sutures of 2-0 silk tacked the stomach circumferentially to the anterior abdominal wall and the balloon was filled with 15 mL of sterile saline and drawn back up against the abdominal wall snugly. The bolster was positioned and secured to the skin with 2-0 nylons which also were used to secure the bolster to the tube itself with a friction-type stitch.   The upper abdomen was irrigated with warm normal saline. This was suctioned clear. The omentum was draped over top of the small intestine and it should be mentioned that although the patient initially had an antecolic Roux limb, I performed the revision with a retrocolic Roux limb and the defect in the transverse mesocolon was to the right of the middle colic vessels. The patient had previously undergone cholecystectomy. The linea alba was closed with a running #1 PDS suture. The subcutaneous tissue was irrigated and the skin was reapproximated with a skin stapling device and a sterile dressing was applied.  The patient tolerated the procedure well and there were no complications.   ____________________________ Claude MangesWilliam F. Demarko Zeimet, MD wfm:ce D: 10/30/2012 12:05:09 ET T: 10/30/2012 12:29:05 ET JOB#: [id number]  cc: Claude MangesWilliam F. Clotine Heiner, MD, <Dictator> Ezzard StandingPaul Y. Bluford Kaufmannh, MD  Claude MangesWILLIAM F Eneida Evers MD ELECTRONICALLY SIGNED 11/01/2012 14:47

## 2014-10-21 NOTE — Consult Note (Signed)
Brief Consult Note: Diagnosis: chronic nausea abdominal pain, severe constipation,.   Patient was seen by consultant.   Consult note dictated.   Recommend further assessment or treatment.   Discussed with Attending MD.   Comments: There is dilation of the stomach which has been bypassed. There are dilated loops of small bowel on the left side of the abdomen not containing any contrast which would be explained by a possible narrowing of the jejuno-jejunoal anastomosis?Marland Kitchen.  Would obtain records from previous surgery in 2013. recommend GI evaluation for possible EGD. Spoke with Dr Nemiah CommanderKalisetti of [facility]..  Electronic Signatures: Natale LayBird, Sriram Febles (MD)  (Signed 30-Apr-14 07:42)  Authored: Brief Consult Note   Last Updated: 30-Apr-14 07:42 by Natale LayBird, Alexzander Dolinger (MD)

## 2014-10-21 NOTE — H&P (Signed)
PATIENT NAME:  Melanie Davidson, BOTTCHER MR#:  [id number] DATE OF BIRTH:  [birth year]  DATE OF ADMISSION:  10/28/2012  PRIMARY CARE PHYSICIAN:  Nonlocal.   REFERRING PHYSICIAN:  Dr. Dolores Frame.   CHIEF COMPLAINT:  Severe abdominal pain associated with persistent nausea.   HISTORY OF PRESENT ILLNESS:  The patient is a 34 year old Caucasian female presenting to the ER with a chief complaint of persistent nausea since October 2013 after she had abdominal surgery for peptic ulcer disease with perforation of the colon, is presenting with worsening of nausea associated with vomiting for the past 2 to 3 days.  This is associated with generalized abdominal pain which is sharp in nature, more in the left side of the abdomen and 10 out of 10.  The patient is denying passing any flatus today.  The patient is evaluated by the ER physician Dr. Dolores Frame and CAT scan of the abdomen and pelvis is done which has revealed partial small bowel obstruction.  As the patient has high-pitched tinkling sounds, Dr. Dolores Frame has called on call surgery consultant Dr. Egbert Garibaldi who has asked her to admit the patient to hospitalist service.  During my examination, the patient is still complaining of 10 out of 10 abdominal pain, more in the left side of the abdomen radiating to rest of the abdomen.  The patient's blood pressure initially was at 121/58, but subsequently it dropped down to 81/41, temperature was 99.1.  The patient denies any fever or diarrhea.  Denies any chest pain or shortness of breath.  PAST MEDICAL HISTORY:  Fibromyalgia, peptic ulcer disease.   PAST SURGICAL HISTORY:  Gastric bypass, cholecystectomy, appendectomy, tubal ligation, oophorectomy, history of peptic ulcer disease with perforation status post repair in [place] in October 2013.   ALLERGIES:  The patient is allergic to ASPIRIN, PENICILLIN, SULFA DRUGS.   HOME MEDICATIONS:  Currently none.   PSYCHOSOCIAL HISTORY:  Lives at home with her family.  Smokes 1 pack in three  days.  Denies alcohol or illicit drug usage.   FAMILY HISTORY:  Mother has history of breast cancer and Grave's disease.   REVIEW OF SYSTEMS:  CONSTITUTIONAL:  Denies any fever, but complaining of fatigue and severe abdominal pain which is generalized, but more in the left side of the abdomen.  She is complaining of weight loss for the past 3 to 4 days as she has been not eating or drinking.  EYES:  No blurry vision, glaucoma or cataracts.  EARS, NOSE, THROAT:  No ear pain, hearing loss, discharge, snoring.  RESPIRATION:  No coughing, wheezing, hemoptysis or dyspnea.  CARDIOVASCULAR:  No chest pain, palpitations or syncope.  GASTROINTESTINAL:  Complaining of persistent nausea which is getting worse for the past 2 to 3 days, vomiting.  Denies any diarrhea.  Generalized abdominal pain, more in the left side of the abdomen.  Denies any hematemesis or melena.  Positive history of peptic ulcer disease.  GENITOURINARY:  No dysuria or hematuria.  GYNECOLOGIC AND BREASTS:  No breast mass or vaginal discharge.  ENDOCRINE:  No polyuria, nocturia or thyroid problems.  HEMATOLOGIC AND LYMPHATIC:  No anemia, easy bruising or bleeding.  INTEGUMENTARY:  No acne, rash, lesions.  MUSCULOSKELETAL:  No pain in the neck, back, shoulder, gout.  NEUROLOGIC:  No history of vertigo, ataxia, CVA, TIA.  PSYCHIATRIC:  Denies any ADD, OCD or bipolar disorder.   PHYSICAL EXAMINATION: VITAL SIGNS:  Temperature 99.1, pulse 81, respirations 18, blood pressure 81/41, pulse ox 100%.  GENERAL APPEARANCE:  Not under acute  distress, but in a lot of discomfort from abdominal pain.  Moderately built and moderately nourished.  HEENT:  Normocephalic, atraumatic.  Pupils are equally reacting to light and accommodation.  No conjunctival injection.  No scleral icterus.  Extraocular movements are intact.  No sinus tenderness.  No postnasal drip.  No pharyngeal exudates.  NECK:  Supple.  No JVD.  No thyromegaly.  No lymphadenopathy.   LUNGS:  Clear to auscultation bilaterally.  No accessory muscle usage.  No anterior chest wall tenderness on palpation.  CARDIAC:  S1, S2 normal.  Regular rate and rhythm.  No murmurs.  GASTROINTESTINAL:  High-pitched tinkling bowel sounds are present.  Generalized abdominal tenderness is present in all four quadrants, but more in the left upper and left lower quadrant.  Positive rebound tenderness.  Could not appreciate any abdominal masses by deep palpation as the patient is severely tender with rebound tenderness.  NEUROLOGIC:  Awake, alert and oriented x 3.  Motor and sensory are grossly intact.  Reflexes are 2+.  MUSCULOSKELETAL:  No joint effusion, tenderness or erythema.  SKIN:  Warm to touch.  Normal turgor.  No rashes or lesions are noted.  EXTREMITIES:  No edema.  No cyanosis.  No clubbing.  PSYCHIATRIC:  Mood and affect cannot be commented as the patient is in severe abdominal pain.   LABORATORY AND IMAGING STUDIES:  CAT scan of the abdomen and pelvis has revealed no evidence of pneumothorax, no pleural effusion, partial small bowel obstruction, previous gastric bypass noted with mild distention of the bypass portion of the stomach and duodenum.  There is mild dilatation of the proximal small bowel loops consistent with partial mechanical small bowel obstruction.  Urinalysis, yellow in color, hazy in appearance, glucose negative, bilirubin 1+, ketones trace, nitrate negative, leukocyte esterase negative.  Urine pregnancy test is negative.  WBC 11.1, hemoglobin 11.6, hematocrit 35.2, platelets 410, glucose 88, BUN 17, creatinine 0.7, sodium 138, potassium 4.2, chloride 105, CO2 25.  GFR greater than 60.  Serum osmolality 277, calcium 8.7, lipase 176.  LFTs are within normal range.   ASSESSMENT AND PLAN:  A 34 year old female coming into the ER with a chief complaint of generalized severe abdominal pain, but more in the left side of the abdomen for the past 3 to 4 days, associated with severe  persistent nausea which has been worse for the past 2 to 3 days with obstipation, will be admitted to hospitalist service as suggested by on-call surgeon.  1.  Partial versus complete small bowel obstruction with a history of multiple abdominal surgeries in the past.  Currently hypotensive.  We will make her nothing by mouth and give her fluid boluses followed by continuous IV fluids.  IV Protonix for GI prophylaxis.  NG tube for decompression.  Morphine IV as needed for pain.  2.  Past medical history of peptic ulcer disease status post perforation and status post repair in October 2013 at [place].  The patient will be covered with IV Protonix for gastrointestinal prophylaxis.  3.  Fibromyalgia.  We will provide her morphine IV as needed basis for pain.  4.  Nicotine dependence.  The patient needs counseling once clinically stable.  5.  We will provide deep vein thrombosis prophylaxis.   CODE STATUS:  She is FULL CODE.   I have called and discussed with the surgeon on call Dr. Egbert Garibaldi who is aware of the situation and we will see the patient as soon as possible.   Total time spent on  admission is 50 minutes.   The diagnosis and plan of care was discussed in detail with the patient.  She is aware of the diagnosis and plan of care.  All her questions were answered to her satisfaction.     ____________________________ Ramonita LabAruna Avriana Joo, MD ag:ea D: 10/28/2012 05:35:17 ET T: 10/28/2012 06:01:54 ET JOB#: [id number]  cc: Ramonita LabAruna Susanna Benge, MD, <Dictator> Ramonita LabARUNA Jalin Erpelding MD ELECTRONICALLY SIGNED 10/30/2012 5:39

## 2014-10-21 NOTE — Consult Note (Signed)
PATIENT NAME:  Melanie Davidson, ORR MR#:  [id number] DATE OF BIRTH:  1980/07/08  DATE OF CONSULTATION:  10/28/2012  CONSULTING PHYSICIAN:  Loraine Leriche A. Egbert Garibaldi, MD  REASON FOR CONSULTATION: Abdominal pain and possible partial small bowel obstruction.   HISTORY: This is a 34 year old white female with a history of Roux-en-Y gastric bypass in 2006, following which she lost approximately 230 pounds. The patient did well up until last year around this time, where she began having nausea, vomiting and inability to take p.o. According to her, the patient had a "breakdown" of her Roux-en-Y gastric bypass, requiring reoperation and repair of a "perforated colon." She states that she is not sure exactly what happened. Following this, the patient had what sounds like an intra-abdominal abscess requiring percutaneous drainage. Of note, the patient has had multiple other abdominal operations, including appendectomy, cholecystectomy, bilateral tubal ligation and removal of an ovarian cyst. Over the last 2 to 3 weeks, the chronic nausea that has been going on since her operation a year ago has developed a component of left sided  abdominal pain as well as mucousy stools, difficulty with p.o. and chronic nausea and vomiting. She states the vomiting is bilious. The patient sought medical attention in the Emergency Room because of persistent symptoms.   Surgical services were asked to evaluate following a CT scan which was read as possible partial small bowel obstruction. Last flatus was 2 days ago, last bowel movement was 2 days ago.   ALLERGIES: ASPIRIN, PENICILLIN AND SULFA.   MEDICATIONS: Omeprazole daily.   SOCIAL HISTORY: The patient denies alcohol and drug use. Smokes a third of a pack of cigarettes a day. Currently unemployed.   FAMILY HISTORY: Significant for breast cancer and thyroid disease.   REVIEW OF SYSTEMS: As described above related to the GI system. She has had a year's history of nausea and vomiting since  her last operation in 2013. Vomiting is bilious. The patient has had no flatus and bowel movement for 2 days.   PHYSICAL EXAMINATION:  GENERAL: She was sleeping upon my arrival, awoke easily, alert and oriented x4.  VITAL SIGNS: Temperature is 97.9, pulse is 71, respiratory rate of 18, blood pressure 110/76, pulse oximetry on room air was 100%.  LUNGS: Clear.  HEART: Regular rate and rhythm.  ABDOMEN: Slightly distended. There are a few bowel sounds. There is a midline incision which is well healed. No obvious hernia. Abdomen is soft, mildly tender, but no peritoneal signs.  EXTREMITIES: Warm and well perfused.  NEUROLOGIC AND PSYCHIATRIC: Normal.   LABORATORY VALUES: Urine pregnancy test is negative. Urinalysis is negative. White count is 11.1, hemoglobin 11.6, hematocrit 35.2, platelet count 410,000. Liver function tests are normal. Electrolytes are unremarkable. BUN is 17, creatinine is 0.71.  I personally reviewed the CT scan which was given with oral contrast. There is a significant amount of constipation. there are suture materials in the area of the previous RNYGB.  Stomach and duodenum are dilated and air filled.  There are dilated fluid and air filled loops of small bowel on the left side of the abdomen and normal calibered small bowel loops on the right side of the abdomen containing contrast.  Contrast is seen in the colon,  Significant amount of constipation.  There was no free air, no pneumatosis, scan free fluid present in pelvis. It is read as a possible partial small bowel obstruction. Official reading is still pending from this institution.   IMPRESSION: A 34 year old white female with history of multiple  abdominal operations with constipation, chronic nausea and vomiting. At this time I am concerned about an anastomotic problem or possible adhesions resulting in dilation of the afferent limb.   RECOMMENDATIONS: NGT to LIWS, consider GI evaluation for EGD, obtain outside records of  surgery 2013.  I will transfer patient to our service.  She may need transfer to a bariatric specialist based on clinical course.  I discussed transfer to surgery with Dr. Nemiah CommanderKalisetti.   ____________________________ Redge GainerMark A. Egbert GaribaldiBird, MD mab:OSi D: 10/28/2012 07:31:05 ET T: 10/28/2012 07:57:02 ET JOB#: [id number]  cc: Loraine LericheMark A. Egbert GaribaldiBird, MD, <Dictator> Raynald KempMARK A Monique Gift MD ELECTRONICALLY SIGNED 10/28/2012 9:37

## 2014-10-21 NOTE — Discharge Summary (Signed)
PATIENT NAME:  Melanie Davidson, Aisa J MR#:  [id number] DATE OF BIRTH:  February 14, 1981  DATE OF ADMISSION:  06/20/2013 DATE OF DISCHARGE:  06/22/2013  PRIMARY CARE PHYSICIAN: Nonlocal.  CONSULTING PHYSICIAN: Dr. Anda KraftMarterre, general surgeon.  DISCHARGE DIAGNOSES:  1.  Ileus. 2.  Peptic ulcer disease.  3.  Anemia.   CONDITION: Stable.   CODE STATUS: FULL code.   HOME MEDICATIONS:  1.  Pepcid 40 mg p.o. b.i.d. 2.  Percocet 5/325 mg oral tablet 1 tablet every 8 hours p.r.n. for 3 days.   DIET: Regular diet.   ACTIVITY: As tolerated.   FOLLOW-UP CARE: Follow up with PCP within 1 to 2 weeks. Follow up with Dr. Anda KraftMarterre within 6 weeks.  REASON FOR ADMISSION: Abdominal pain, persistent nausea and vomiting.   HOSPITAL COURSE: The patient is 34 year old Caucasian female with a history of multiple surgical procedures on her abdomen including Rou-en-Y anastomosis of her stomach, presented to the ED with abdominal pain and persistent nausea and vomiting. The abdominal pain radiated to back, 9 out of 10 in intensity, worsening with vomiting, better with resting. In the ED, the patient's x-ray showed significant air fluid levels and her CAT scan of abdomen showed significant ileus without any signs of obstruction. The patient had a large amount of stool in her rectum, in the lower intestine area. For detailed history and physical examination, please refer to the admission note dictated by Dr. Mordecai MaesSanchez. On admission date, the patient's urinalysis was unremarkable. Electrolytes were normal. White count 4.7 and hemoglobin 11.8. Glucose 80.  1. Ileus. After admission, the patient was kept n.p.o. with Zofran IV p.r.n. and Dilaudid p.r.n. Dr. Anda KraftMarterre evaluated the patient and suggested there is no indication for surgery. The patient has a history of ventral hernia, but the patient needs to follow up with him as outpatient. Dr. Anda KraftMarterre suggested to start clear liquids. Last night the patient tolerated clear liquid. We  advanced diet to full liquid this morning and regular diet during the lunchtime. The patient tolerated diet well.  2.  PUD.  The patient has been treated with Protonix twice a day. 3.  Iron deficiency anemia. Hemoglobin has been stable.   The patient's symptoms have much improved. Vital signs are stable. She is clinically stable and will be discharged to home today. I discussed the patient's discharge plan with the patient, nurse, and case manager.   TIME SPENT: About 35 minutes.  ____________________________ Shaune PollackQing Hagan Maltz, MD qc:sb D: 06/22/2013 15:44:16 ET T: 06/22/2013 16:27:29 ET JOB#: [id number]  cc: Shaune PollackQing Fuquan Wilson, MD, <Dictator> Shaune PollackQING Amoreena Neubert MD ELECTRONICALLY SIGNED 06/22/2013 17:13

## 2014-10-21 NOTE — Consult Note (Signed)
Brief Consult Note: Diagnosis: ileus, constipation, ventral hernia.   Patient was seen by consultant.   Orders entered.   Comments: Pt well known to me. 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 Currently hospitalized for constipation and ileus, which is quite unusal for her. This is resolving. She also has a painful ventral hernia, which is easily reducible. [facility] Will sign off.  Electronic Signatures: Claude MangesMarterre, Hesham Womac F (MD)  (Signed 22-Dec-14 19:53)  Authored: Brief Consult Note   Last Updated: 22-Dec-14 19:53 by Claude MangesMarterre, Kess Mcilwain F (MD)

## 2014-10-21 NOTE — Consult Note (Signed)
PATIENT NAME:  Melanie Davidson, Melanie Davidson MR#:  [id number] DATE OF BIRTH:  Sep 23, 1980  DATE OF CONSULTATION:  10/28/2012  PRIMARY CARE PHYSICIAN: No one locally.  ATTENDING PHYSICIAN: Dr. Enid Baas.  CONSULTING: [facility]: 1. Rodman Key, NP. 2. Dr. Lutricia Feil.   REASON FOR CONSULT: Status post gastric bypass abdominal pain, request EGD evaluation.   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 The patient did unfortunately suffer from a staph infection as well as an abscess formation after undergoing surgery for peptic ulcer disease with perforation. She is currently on omeprazole 20 mg once a day and reflux symptoms were well controlled; this was initiated after having the surgery. She presented to the Emergency Room with the complaint of severe abdominal pain which started to occur yesterday evening after eating one sauted carrot and string bean approximately 5:30 yesterday evening and vomited immediately after eating. In reviewing history, she has actually been experiencing persistent GI symptoms for the past several months, stating that no matter what she eats or drinks that she will end up vomiting within a few minutes. She even placed herself on a five-day pouch diet which did not seem to help in resolving her symptoms. She normally smokes a pack of cigarettes a day and now currently is smoking one pack over a three-day period as smoking causes nausea to be much worse. Pain is present to the left side of her abdomen radiating around to the left flank posterior aspect underneath the  left rib cage. This has been occurring for the past several months. Pain is always a dull, achy pain which is exacerbated and worsened with eating. The patient has felt that she has lost weight but being weighed on a bed scale after admission, she has actually gained 8 pounds since October of last year. In association, fever of 101 to 103 intermittently over the past several months usually occurs every couple of days and takes Tylenol as needed and the fever gets well controlled. No evidence of melena. Bright red blood per rectum intermittently. The patient states that she is very rare to pass feces over the past two years, usually passage of excessive amount of mucus several times a day. Bright red blood per rectum she has felt is hemorrhoidal in nature. Associated chest pain discomfort. Belching at times, no passage of flatulence per se. The patient feels that her symptoms are very similar in presentation to when she had peptic ulcer disease.   Had an EGD and possibly a colonoscopy that was done in 2007 prior to her Roux-en-Y surgery. This was done while residing in [place]. All of her old records there and pending to be received here at [facility].   CT scan with contrast of abdomen and pelvis done on admission to the Emergency Room. Findings of minimal basilar opacities likely secondary to atelectasis, minimal central intrahepatic biliary dilatation likely related to prior cholecystectomy. Spleen, adrenals and pancreas are unremarkable. Small enhancing nodule just superior to the left kidney likely representing a splenule similar to prior imaging. Postoperative changes seen from prior gastric bypass. There is air and some fluid within the excluded portion of the stomach. There are several  loops of mildly distended small bowel in the left hemi-abdomen without definite transition point. Oral contrast material is seen to the level of the transverse colon. The appendix is not identified. A suture line is at the  base of the cecum. A trace amount of free fluid in the pelvis likely physiologic. A small cyst is seen on the right ovary. Impression does note partial or intermittent small bowel obstruction could not be excluded. Currently, NG tube is present to right nostril. It was placed at approximately 11:00 a.m. today and very minimal amount of yellow color liquid has been suctioned. No evidence of coffee-ground emesis or blood. CBC last evening at approximately 9:00 p.m. revealed white count to be elevated at 11.1, hemoglobin 11.6. RDW elevated at 16.9, otherwise within normal limits. Chemistry panel within normal limits. Hepatic panel within normal limits except AST low at 13. Urinalysis revealed +1 bilirubin, protein was 30 mg per dL and negative pregnancy test. The patient currently does not have IV access, it is being reattempted. Order, though, is present for Protonix 40 mg IV every 12 hours. Dr. Egbert Garibaldi was consulted based on findings on CT scan. The patient has undergone surgical evaluation.   HOME MEDICATIONS: Omeprazole 20 mg a day, vitamins, Zoloft 200 mg a day, Ambien as directed as needed and Xanax as directed as needed.   ALLERGIES: ASPIRIN, PENICILLIN and SULFA.  PAST MEDICAL HISTORY: Fibromyalgia, chronic abdominal pain, peptic ulcer disease, endometriosis, ovarian cysts.   PAST SURGICAL HISTORY: Gastric bypass Roux-en-Y 2007, cholecystectomy 2005, appendectomy, tubal ligation, right centimeter left ovarian mass excised, laparoscopy with lysis of adhesions and ovarian cyst removal, history of peptic ulcer disease with perforation status post repair [facility] October 2013, after surgery contacted staph infection and abscess developed.   FAMILY HISTORY: Mom: History of breast cancer diagnosed at the age of 35, also significant history for thyroid cancer, Crohn's disease and Graves' disease. Nephew: History of celiac disease. A 24-year-old nephew: History of renal cancer. Grandmother: Maternal history  of renal cancer, end-stage renal disease. Grandfather: Paternal history of colon cancer diagnosed at the age of 68.   SOCIAL HISTORY: Resides with her family. Smoking 1 pack of cigarettes every three days. No alcohol. No recreational drug use. Married, four children. Currently not working.   REVIEW OF SYSTEMS: All 10 systems reviewed and checked, otherwise unremarkable other than what was stated above.   PHYSICAL EXAMINATION:  VITAL SIGNS: Temperature is 97.9, pulse is 68, respirations are 18, blood pressure is 99/65 and pulse ox is 98% on room air.  GENERAL: Well-developed, well-nourished, 34 year old Caucasian female, no acute distress noted. Pleasant. Does appear in mild discomfort.  HEENT: Normocephalic, atraumatic. Pupils equal and reactive to light. Conjunctivae clear. NG tube present right nostril to low intermittent suction.  NECK: Supple. Trachea midline. No lymphadenopathy or thyromegaly.  PULMONARY: Symmetric rise and fall of chest. Clear to auscultation throughout. No adventitious sounds.  CARDIOVASCULAR: Regular rhythm. S1, S2. No murmurs, no gallops.  ABDOMEN: Distended but soft to palpation. Marked discomfort noted to left side of abdomen. Bowel sounds present in all 4 quadrants. No bruits, no masses. Evidence of incisional scars noted, specifically a large scar noted midline.  RECTAL: Deferred.  MUSCULOSKELETAL: Moving all 4 extremities. No contractures. No clubbing.  EXTREMITIES: No edema.  NEUROLOGICAL: No gross neurological deficits noted.  PSYCHIATRIC: Alert and oriented x 4. Memory grossly intact. Appropriate affect and mood.   LABORATORY/DIAGNOSTICS: Findings as noted under History section.   IMPRESSION: 1. Significant surgical history as noted  under surgical section, admitted for partial small bowel obstruction. Persistent intractable nausea and vomiting. Concern existing for obstructive process possibly at the site of where Roux-en-Y was performed.  2. Known history of  chronic abdominal pain, fibromyalgia.  3. Significant medical history for peptic ulcer disease with perforation of the colon 2013. 4. Questionable hematemesis, dark coffee-ground like emesis yesterday evening.   PLAN: The patient's presentation was discussed with Dr. Lutricia FeilPaul Oh. Our recommendation is to proceed forward with an upper endoscopy tomorrow to allow direct luminal evaluation of upper GI tract. Order placed. The patient will remain n.p.o. at this time. NG tube to remain in place to low intermittent suction. IV access once reestablished, patient is to be resumed on Protonix 40 mg IV every 12 hours. Will continue to monitor electrolyte status as well as pain management. Did discuss with the patient that once nausea and vomiting hopefully becomes improved that she should consider proceeding forward with a diagnostic colonoscopy given this noted change in bowel habits over the past two years. Given family history of IBD, i.e. Crohn's disease involving her mother, increased risk, and do feel that direct luminal evaluation is warranted as well on this basis.   These services provided by Rodman Keyawn S. Dannica Bickham, NP under collaborative agreement with Dr. Lutricia FeilPaul Oh.   Thank you for allowing us to participate in the care of the patient.    ____________________________ Rodman Keyawn S. Kayla Deshaies, NP dsh:es D: 10/28/2012 15:00:00 ET T: 10/28/2012 15:31:05 ET JOB#: [id number]  cc: Rodman Keyawn S. Dallie Patton, NP, <Dictator> Rodman KeyAWN S Ferlando Lia MD ELECTRONICALLY SIGNED 10/28/2012 16:50

## 2014-10-21 NOTE — H&P (Signed)
PATIENT NAME:  Melanie Davidson, Melanie Davidson MR#:  [id number] DATE OF BIRTH:  05/13/1981  DATE OF ADMISSION:  06/20/2013  PRIMARY CARE PHYSICIAN: Dr. Abbe [facility]kins at [facility].   SURGEON:  Dr. Anda KraftMarterre.   REFERRING PHYSICIAN: Dr. Governor Rooksebecca Lord   CHIEF COMPLAINT: Abdominal pain, persistent nausea and vomiting.   HISTORY OF PRESENT ILLNESS: This is a very nice 34 year old female who has history of multiple surgical procedures on her abdomen including a Rou-en-Y anastomosis of her stomach.  The patient has been doing okay. She has been admitted in the past for the same type of symptoms. Last admission that I had listed over here was on 10/28/2012, but she also presented to the ER and deferred admission on 03/03/2013. Since then she has been doing okay. She was recently in prison, she said jail, for an assault charge and she was there the last Thursday.  She states that she has not had a bowel movement since prior Wednesday, then she started having abdominal pain on Thursday afternoon. Her abdominal pain is described in the middle of the abdomen which is the area where she has a protruding hernia. The hernia is not incarcerated and is easily reducible, but it hurts to palpation. The pain radiates to the back, it is mesogastric, 9 out of 10 in intensity, worse with vomiting,  better to with resting. She denies any diarrhea right now, but prior to this she has been having loose stools for which she takes Lomotil. Dr. Mechele CollinElliott has told her not to take more of this medication. The patient says that her constipation never happened up until this Wednesday. She has vomited six times today and at least 10 times a day since Thursday. The patient has been evaluated the Emergency Department with x-ray that showed significant air-fluid levels and her CT scan shows had significant ileus without any sign of obstruction. She had large amount of stool in her rectum and lower intestinal areas. The patient is admitted for treatment of this  condition.   REVIEW OF SYSTEMS:   CONSTITUTIONAL: Denies any fever, fatigue, weight loss or weight gain.  EYES: No blurry vision, double vision.  ENT: No difficulty swallowing. No tinnitus.  RESPIRATORY: No cough, wheezing, hemoptysis or chronic obstructive pulmonary disease.  CARDIOVASCULAR: No chest pain, orthopnea no syncope.   GASTROINTESTINAL: Positive nausea. No diarrhea. Positive constipation, positive vomiting, positive for abdominal pain as mentioned above. No jaundice, no rectal bleeding. No melena.  GENITOURINARY: No dysuria, hematuria, or changes in frequency. By the way, patient has history of peptic ulcer disease and she was taking Pepcid for his.  ENDOCRINE: No polyuria, polydipsia, polyphagia. No cold or heat intolerance.  GYNECOLOGIC: No breast masses or GYN issues.  HEMATOLOGY AND LYMPHATIC: No anemia, easy bruising or swollen glands.  SKIN: No rashes, petechiae, or new lesions.  NEUROLOGIC: No numbness, tingling, or headaches.  PSYCHIATRIC: No significant depression or anxiety at this moment.   PAST MEDICAL HISTORY: 1.  Insomnia.  2.  Depression.  3.  Anxiety.  4.  Fibromyalgia.  5.  Peptic ulcer disease.  6.  History of perforation the colon due to an ulcer.  7.  Iron deficiency anemia.   PAST SURGICAL HISTORY:  1.  Gastric anastomosis, Rou-en-Y gastric bypass.  2.  Multiple exploratory surgeries for revision nine gastroenterology jejunal anastomosis with pyloroplasty and tube placement for gastrostomy.  3.  Cholecystectomy.  4.  Appendectomy.  5.  Bilateral tubal ligation.  6.  Oophorectomy.  7.  History of open surgery due  to peptic ulcer disease with perforation with repair in [place] in 2013.   ALLERGIES:  1.  ASPIRIN APPARENTLY ANAPHYLACTIC REACTION.   2.  PENICILLIN AND SULFA GIVE HER HIVES.   HOME MEDICATIONS:  1.  Amitriptyline.  2.  Ambien.  3.  Xanax.  4.  Potassium chloride.   SOCIAL HISTORY: The patient lives at home with her fiance.  She smokes 1/2 pack a day for 15 years. She has been given smoking cessation counseling for over three minutes. The patient states that she is going to try quitting. She does not drink alcohol. She denies any use of drugs. The patient was in jail since this past Wednesday due to assault and she is going to have court in January and possibly stay there until then.    FAMILY HISTORY: Positive for breast cancer and Graves' disease in mother.   PHYSICAL EXAMINATION: VITAL SIGNS: Blood pressure 120/60, pulse 80, respirations 22, temperature 97.9, oxygen saturation 96% on room air.  GENERAL: Alert and oriented x 3, mild distress due to abdominal pain, but the patient is hemodynamically stable.  HEENT: Pupils are equal and reactive. Extraocular movements are intact. Mucosae are moist. Anicteric sclerae. Pink conjunctivae. No oral lesions. No oropharyngeal exudates.  NECK: Supple. No JVD. No thyromegaly. No adenopathy. No carotid bruits.  CARDIOVASCULAR: Regular rate and rhythm. No murmurs, rubs or gallops, no displacement of  PMI.  LUNGS: Clear without any wheezing or crepitus. No use of accessory muscles.  ABDOMEN: Distended, midline scar, which is healed.  There is a hernia at the level of the mesogastrium, which is distended and tender to palpation, although he is easy to reduce. The patient has no rebound tenderness, but the abdomen is very tympanic.  GENITAL: Deferred.  EXTREMITIES: No edema, cyanosis or clubbing.  VASCULAR: Pulses +2. Capillary refill less than 3.  MUSCULOSKELETAL: No joint effusions or joint swelling.  NEUROLOGIC: Cranial nerves II through 12 intact. Strength is 5/5 in all four extremities. No focal findings.  PSYCHIATRIC: Mild anxiety due to pain, but the patient is not agitated. She is alert and oriented x3.  LYMPHATIC: Negative for lymphadenopathy in neck or supraclavicular area.   LABORATORY, DIAGNOSTIC AND RADIOLOGIC DATA:  CT scan mentioned above, multiple fluid levels,  lots of stool and ileus.   KUB as mentioned above, multiple of air-fluid levels.   Glucose is 80, creatinine 0.6, sodium 140, potassium 4, electrolytes within normal limits. LFTs within normal limits. White count is 4.7. Hemoglobin is 11.8, platelets 242.   Urinalysis: Unremarkable.   ASSESSMENT AND PLAN: This is a 34 year old female with multiple hospitalizations due to ileus and abdominal obstruction. She has multiple surgeries on her abdominal cavity including Rou-en-Y, gastric jejunal anastomosis and significant history of peptic ulcer disease.  1.  Ileus. The patient comes with abdominal pain, which is significant pain, 10 out of 10, 9 out of 10. She is requiring IV medications due to her intractable pain. She is going to be kept nothing oral. At this moment there is no neurosurgical issue going. General surgery was curbside and they did not see the need of surgery involvement acutely in the ER, but that will be happy to be consulted in the morning as Dr. Anda Kraft,  who is her doctor will come in.  The patient is going to be mentating with IV fluids. If the vomiting persists, we are going to put in an NG tube. This moment there are no signs of bleeding. The patient has significant large amount  of stool in her rectum and lowering intestinal areas for what we are going to do enemas.  Likely the reason of the ileus is secondary to obstipation. The patient denies any use of narcotics. The patient says that she just has not gone to the bathroom in several days. Work on [employer].  2.  Peptic ulcer disease. The patient has been taking occasionally Pepcid. We are going to put her on twice a day Protonix.  3.  Insomnia/depression. Continue on amitriptyline and Ambien and Xanax for anxiety.  4.  Iron deficiency anemia. Hemoglobin is stable. Continue iron once the patient is tolerating oral.   5.  The patient is a full code.  6.  Gastrointestinal prophylaxis with proton pump inhibitor deep, venous  thrombosis prophylaxis at this moment, only PLCs as the patient could at any point require surgery, doubted, but there is possibility, so ovoid heparin or Lovenox for now. The patient is ambulatory. The patient was in prison, for which she needs an officer all the time.    TIME SPENT: I spent about 45 minutes with this patient.    ____________________________ Felipa Furnace, MD rsg:cc D: 06/20/2013 20:11:51 ET T: 06/20/2013 21:25:52 ET JOB#: [id number]  cc: Felipa Furnace, MD, <Dictator> Ezana Hubbert Juanda Chance MD ELECTRONICALLY SIGNED 06/27/2013 12:47

## 2014-10-22 NOTE — Op Note (Signed)
PATIENT NAME:  Melanie Davidson, Melanie Davidson MR#:  [id number] DATE OF BIRTH:  1981-06-21  DATE OF PROCEDURE:  08/16/2013  PREOPERATIVE DIAGNOSIS: Ventral hernia.   POSTOPERATIVE DIAGNOSIS: Ventral hernia x 2, 2 x 4 cm, 1 x 1 cm.   SURGEON: Presly Steinruck A. Aryaa Bunting, MD   ASSISTANT: Sheppard Plumberimothy E. Oaks, MD  ESTIMATED BLOOD LOSS: 5 mL.   ANESTHESIA: General endotracheal.   SPECIMEN: Hernia sac.  INDICATION FOR SURGERY: Ms. Melanie Davidson is a pleasant 34 year old female who presented with a ventral hernia after a previous surgery that was symptomatic, thus brought to the operating room for ventral hernia repair with mesh.   DETAILS OF PROCEDURE: As follows: Informed consent was obtained. Ms. Melanie Davidson was brought to the operating room suite. She was laid supine on the operating room table. She was induced. Endotracheal tube was placed. General anesthesia was administered. Her abdomen was then prepped and draped in standard surgical fashion. A timeout was then performed correctly identifying patient name, operative site, and procedure to be performed. An incision was made to the midline of the palpable defect. This was deepened down to the fascia. The hernia sac was encountered. There was a defect and the sac was excised, and all the fascia surrounding the defect was cleaned off. It measured 2 cm long x 4 cm wide. I then palpated under through the hernia and felt a smaller defect more cephalad, but approximately 2 cm away. I then dissected that hernia sac and obtained clean edges. I decided at that time, due to its small size, to close the more superior hernia primarily with interrupted 0 Ethibond sutures which were tied. I then placed an 8 cm in diameter Ventralex patch through the inferior defect and closed the defect transversely, incorporating the tails and the pocket of the mesh into the repair with interrupted 0 Ethibonds. After I was happy with the close, I then closed her skin with staples and placed a sterile  dressing. The patient was then awoken, extubated, and brought to the postanesthesia care unit. There were no immediate complications. Needle, sponge, and instrument counts were correct at the end of the procedure.    ____________________________ Si Raiderhristopher A. Rayvn Rickerson, MD cal:jcm D: 08/17/2013 15:59:43 ET T: 08/17/2013 16:11:33 ET JOB#: [id number]  cc: Cristal Deerhristopher A. Rogerio Boutelle, MD, <Dictator> Jarvis NewcomerHRISTOPHER A Chantille Navarrete MD ELECTRONICALLY SIGNED 08/19/2013 9:06

## 2015-09-01 ENCOUNTER — Encounter: Payer: Self-pay | Admitting: Emergency Medicine

## 2015-09-01 ENCOUNTER — Emergency Department
Admission: EM | Admit: 2015-09-01 | Discharge: 2015-09-01 | Disposition: A | Discharge status: Home / Self Care | Payer: Self-pay | Attending: Emergency Medicine | Admitting: Emergency Medicine

## 2015-09-01 ENCOUNTER — Emergency Department: Payer: Self-pay

## 2015-09-01 DIAGNOSIS — K567 Ileus, unspecified: Secondary | ICD-10-CM

## 2015-09-01 DIAGNOSIS — M549 Dorsalgia, unspecified: Secondary | ICD-10-CM | POA: Insufficient documentation

## 2015-09-01 DIAGNOSIS — F17210 Nicotine dependence, cigarettes, uncomplicated: Secondary | ICD-10-CM | POA: Insufficient documentation

## 2015-09-01 DIAGNOSIS — R1011 Right upper quadrant pain: Secondary | ICD-10-CM

## 2015-09-01 DIAGNOSIS — Z3202 Encounter for pregnancy test, result negative: Secondary | ICD-10-CM | POA: Insufficient documentation

## 2015-09-01 DIAGNOSIS — Z880 Allergy status to penicillin: Secondary | ICD-10-CM | POA: Insufficient documentation

## 2015-09-01 HISTORY — DX: Gastrojejunal ulcer, unspecified as acute or chronic, without hemorrhage or perforation: K28.9

## 2015-09-01 LAB — COMPREHENSIVE METABOLIC PANEL
ALK PHOS: 61 U/L (ref 38–126)
ALT: 17 U/L (ref 14–54)
ANION GAP: 12 (ref 5–15)
AST: 33 U/L (ref 15–41)
Albumin: 4.3 g/dL (ref 3.5–5.0)
BUN: 14 mg/dL (ref 6–20)
CALCIUM: 9.1 mg/dL (ref 8.9–10.3)
CO2: 27 mmol/L (ref 22–32)
Chloride: 98 mmol/L — ABNORMAL LOW (ref 101–111)
Creatinine, Ser: 0.58 mg/dL (ref 0.44–1.00)
Glucose, Bld: 90 mg/dL (ref 65–99)
Potassium: 3.8 mmol/L (ref 3.5–5.1)
SODIUM: 137 mmol/L (ref 135–145)
TOTAL PROTEIN: 7.6 g/dL (ref 6.5–8.1)
Total Bilirubin: 0.4 mg/dL (ref 0.3–1.2)

## 2015-09-01 LAB — URINALYSIS COMPLETE WITH MICROSCOPIC (ARMC ONLY)
BILIRUBIN URINE: NEGATIVE
Bacteria, UA: NONE SEEN
Glucose, UA: NEGATIVE mg/dL
Hgb urine dipstick: NEGATIVE
NITRITE: NEGATIVE
PH: 7 (ref 5.0–8.0)
PROTEIN: 30 mg/dL — AB
SPECIFIC GRAVITY, URINE: 1.025 (ref 1.005–1.030)

## 2015-09-01 LAB — CBC
HCT: 34.5 % — ABNORMAL LOW (ref 35.0–47.0)
HEMOGLOBIN: 10.8 g/dL — AB (ref 12.0–16.0)
MCH: 24.6 pg — AB (ref 26.0–34.0)
MCHC: 31.4 g/dL — ABNORMAL LOW (ref 32.0–36.0)
MCV: 78.5 fL — ABNORMAL LOW (ref 80.0–100.0)
PLATELETS: 317 10*3/uL (ref 150–440)
RBC: 4.4 MIL/uL (ref 3.80–5.20)
RDW: 19.9 % — ABNORMAL HIGH (ref 11.5–14.5)
WBC: 5.6 10*3/uL (ref 3.6–11.0)

## 2015-09-01 LAB — POCT PREGNANCY, URINE: Preg Test, Ur: NEGATIVE

## 2015-09-01 LAB — LIPASE, BLOOD: Lipase: 34 U/L (ref 11–51)

## 2015-09-01 MED ORDER — OXYCODONE-ACETAMINOPHEN 5-325 MG PO TABS: 1.0000 | ORAL_TABLET | ORAL | Status: DC | PRN

## 2015-09-01 MED ORDER — ONDANSETRON HCL 4 MG PO TABS: 4.0000 mg | ORAL_TABLET | Freq: Three times a day (TID) | ORAL | Status: DC | PRN

## 2015-09-01 MED ORDER — HYDROMORPHONE HCL 1 MG/ML IJ SOLN
1.0000 mg | Freq: Once | INTRAMUSCULAR | Status: AC
  Administered 2015-09-01: 1 mg via INTRAVENOUS
  Filled 2015-09-01: qty 1

## 2015-09-01 MED ORDER — ONDANSETRON HCL 4 MG/2ML IJ SOLN
4.0000 mg | Freq: Once | INTRAMUSCULAR | Status: AC
  Administered 2015-09-01: 4 mg via INTRAVENOUS
  Filled 2015-09-01: qty 2

## 2015-09-01 MED ORDER — OXYCODONE-ACETAMINOPHEN 5-325 MG PO TABS
2.0000 | ORAL_TABLET | Freq: Once | ORAL | Status: AC
  Administered 2015-09-01: 2 via ORAL
  Filled 2015-09-01: qty 2

## 2015-09-01 MED ORDER — MORPHINE SULFATE (PF) 4 MG/ML IV SOLN
4.0000 mg | Freq: Once | INTRAVENOUS | Status: AC
  Administered 2015-09-01: 4 mg via INTRAVENOUS
  Filled 2015-09-01: qty 1

## 2015-09-01 MED ORDER — IOHEXOL 300 MG/ML  SOLN
75.0000 mL | Freq: Once | INTRAMUSCULAR | Status: AC | PRN
  Administered 2015-09-01: 75 mL via INTRAVENOUS

## 2015-09-01 MED ORDER — IOHEXOL 240 MG/ML SOLN
25.0000 mL | Freq: Once | INTRAMUSCULAR | Status: AC | PRN
  Administered 2015-09-01: 25 mL via ORAL

## 2015-09-01 MED ORDER — SODIUM CHLORIDE 0.9 % IV BOLUS (SEPSIS)
1000.0000 mL | Freq: Once | INTRAVENOUS | Status: AC
  Administered 2015-09-01: 1000 mL via INTRAVENOUS

## 2015-09-01 NOTE — ED Provider Notes (Signed)
[facility] Emergency Department Provider Note    ____________________________________________  Time seen: ~1505  I have reviewed the triage vital signs and the nursing notes.   HISTORY  Chief Complaint Abdominal Pain   History limited by: Not Limited   HPI Melanie Davidson is a 35 y.o. female with history of multiple abdominal surgeries presents to the emergency department today because of concerns for right upper quadrant pain. She states the pain started 2 days ago. It started all of a sudden. It has progressively gotten worse. It has been constant. It is sharp. It radiates to her back.  She denies any modifying factors. She has tried over-the-counter medications without any significant relief. She has had some nausea but no vomiting. No bloody stools or diarrhea. She has had some subjective fevers. She states that the pain feels similar to when she had a perforated ulcer and number of years ago.   Past Medical History  Diagnosis Date  . Ulcer of the stomach and intestine     There are no active problems to display for this patient.   Past Surgical History  Procedure Laterality Date  . Abdominal surgery    . Gastric bypass      No current outpatient prescriptions on file.  Allergies Aspirin; Sulfa antibiotics; and Penicillins  No family history on file.  Social History Social History  Substance Use Topics  . Smoking status: Current Some Day Smoker -- 0.25 packs/day    Types: Cigarettes  . Smokeless tobacco: None  . Alcohol Use: None     Comment: rarely    Review of Systems  Constitutional: Positive for subjective fever Cardiovascular: Negative for chest pain. Respiratory: Negative for shortness of breath. Gastrointestinal: Positive for RUQ pain, nausea Genitourinary: Negative for dysuria. Musculoskeletal: Positive for right sided back pain. Skin: Negative for rash. Neurological: Negative for headaches, focal weakness or  numbness.   10-point ROS otherwise negative.  ____________________________________________   PHYSICAL EXAM:  VITAL SIGNS: ED Triage Vitals  Enc Vitals Group     BP 09/01/15 1336 145/88 mmHg     Pulse Rate 09/01/15 1336 106     Resp 09/01/15 1336 18     Temp 09/01/15 1336 97.8 F (36.6 C)     Temp Source 09/01/15 1336 Oral     SpO2 09/01/15 1336 100 %     Weight 09/01/15 1336 150 lb (68.04 kg)     Height 09/01/15 1336 5\' 1"  (1.549 m)     Head Cir --      Peak Flow --      Pain Score 09/01/15 1336 10   Constitutional: Alert and oriented. Appears mildly uncomfortable.  Eyes: Conjunctivae are normal. PERRL. Normal extraocular movements. ENT   Head: Normocephalic and atraumatic.   Nose: No congestion/rhinnorhea.   Mouth/Throat: Mucous membranes are moist.   Neck: No stridor. Hematological/Lymphatic/Immunilogical: No cervical lymphadenopathy. Cardiovascular: Normal rate, regular rhythm.  No murmurs, rubs, or gallops. Respiratory: Normal respiratory effort without tachypnea nor retractions. Breath sounds are clear and equal bilaterally. No wheezes/rales/rhonchi. Gastrointestinal: Tender to palpation in the epigastric and right upper quadrant. Soft. No rebound. No guarding.  Genitourinary: Deferred Musculoskeletal: Normal range of motion in all extremities. No joint effusions.  No lower extremity tenderness nor edema. Neurologic:  Normal speech and language. No gross focal neurologic deficits are appreciated.  Skin:  Skin is warm, dry and intact. No rash noted. Psychiatric: Mood and affect are normal. Speech and behavior are normal. Patient exhibits appropriate insight  and judgment.  ____________________________________________    LABS (pertinent positives/negatives)  Labs Reviewed  COMPREHENSIVE METABOLIC PANEL - Abnormal; Notable for the following:    Chloride 98 (*)    All other components within normal limits  CBC - Abnormal; Notable for the following:     Hemoglobin 10.8 (*)    HCT 34.5 (*)    MCV 78.5 (*)    MCH 24.6 (*)    MCHC 31.4 (*)    RDW 19.9 (*)    All other components within normal limits  URINALYSIS COMPLETEWITH MICROSCOPIC ([facility] ONLY) - Abnormal; Notable for the following:    Color, Urine YELLOW (*)    APPearance CLEAR (*)    Ketones, ur TRACE (*)    Protein, ur 30 (*)    Leukocytes, UA TRACE (*)    Squamous Epithelial / LPF 0-5 (*)    All other components within normal limits  LIPASE, BLOOD  POCT PREGNANCY, URINE     ____________________________________________   EKG  I, Phineas Semen, attending physician, personally viewed and interpreted this EKG  EKG Time: 1333 Rate: 88 Rhythm: normal sinus rhythm Axis: normal Intervals: qtc 440 QRS: narrow ST changes: no st elevation Impression: normal ekg ____________________________________________    RADIOLOGY  CT abd/pel  IMPRESSION: 1. Distended proximal small bowel, but no discrete transition point. This may be a mild adynamic ileus. This was present do a somewhat similar degree on the prior CT. The distention of the proximal small bowel may be from rapid filling to the small functional stomach into the gastrojejunostomy. 2. No evidence of bowel inflammation. 3. Status post cholecystectomy and appendectomy.  ____________________________________________   PROCEDURES  Procedure(s) performed: None  Critical Care performed: No  ____________________________________________   INITIAL IMPRESSION / ASSESSMENT AND PLAN / ED COURSE  Pertinent labs & imaging results that were available during my care of the patient were reviewed by me and considered in my medical decision making (see chart for details).  Patient with history of multiple abdominal surgeries and issues who presented to the emergency department today because of concerns for right upper quadrant abdominal pain. DT scan was obtained which showed a possible ileus. The patient did feel better  after IV medications and felt comfortable with trial of by mouth pain and nausea medications at home. We did discuss return precautions with the patient verbalized she understood well.  ____________________________________________   FINAL CLINICAL IMPRESSION(S) / ED DIAGNOSES  Final diagnoses:  Right upper quadrant pain  Ileus (HCC)     Phineas Semen, MD 09/01/15 2003

## 2015-09-01 NOTE — ED Notes (Signed)
Pt here with c/o RUQ pain that began Wed. States it hurts through to her back and feels like a "stabbing, burning pain." Pt tearful in triage, states the last time she felt pain like this she was taken to surgery for an ulcer. Denies vomiting, has not eaten today, nothing makes it better at this point.

## 2015-09-01 NOTE — Discharge Instructions (Signed)
Please seek medical attention for any high fevers, chest pain, shortness of breath, change in behavior, persistent vomiting, bloody stool or any other new or concerning symptoms.   Ileus  Ileus is a condition in which the intestines, also called the bowels, stop working and moving correctly. If the intestines stop working, food cannot pass through to get digested. The intestines are hollow organs that digest food after the food leaves the stomach. These organs are long, muscular tubes that connect the stomach to the rectum. When ileus occurs, the muscular contractions that cause food to move through the intestines stop happening as they normally would. Ileus can occur for various reasons. This condition is a serious problem that usually requires hospitalization. It can cause symptoms such as nausea, abdominal pain, and bloating. Ileus can last from a few hours to a few days. If the intestines stop working because of a blockage, that is a different condition that is called a bowel obstruction. CAUSES This condition may be caused by:  Surgery on the abdomen.  An infection or inflammation in the abdomen. This includes inflammation of the lining of the abdomen (peritonitis).  Infection or inflammation in other parts of the body, such as pneumonia or pancreatitis.  Passage of gallstones or kidney stones.  Damage to the nerves or blood vessels that go to the intestines.  A collection of blood within the abdominal cavity.  Imbalance in the salts in the blood (electrolytes).  Injury to the brain or spinal cord.  Medicines. Many medicines, including strong pain medicines, can cause ileus or make it worse. SYMPTOMS Symptoms of this condition include:  Bloating of the abdomen.  Pain or discomfort in the abdomen.  Poor appetite.  Nausea and vomiting.  Lack of normal bowel sounds, such as "growling" in the stomach. DIAGNOSIS This condition may be diagnosed with:  A physical exam and  medical history.  X-rays or a CT scan of the abdomen. You may also have other tests to help find the cause of the condition. TREATMENT Treatment for this condition may include:  Resting the intestines until they start to work again. This is often done by:  Stopping oral intake of food and drink. You will be given fluid through an IV tube to prevent dehydration.  Placing a small tube (nasogastric tube or NG tube) that is passed through your nose and into your stomach. The tube is attached to a suction device and keeps the stomach emptied out. This allows the bowels to rest and also helps to reduce nausea and vomiting.  Correcting any electrolyte imbalance by giving supplements in the IV fluid.  Stopping any medicines that might make ileus worse.  Treating any condition that may have caused ileus. HOME CARE INSTRUCTIONS  Follow instructions from your health care provider about diet and fluid intake. Usually, you will be told to:  Drink plenty of clear fluids.  Avoid alcohol.  Avoid caffeine.  Eat a bland diet.  Get plenty of rest. Return to your normal activities as told by your health care provider.  Take over-the-counter and prescription medicines only as told by your health care provider.  Keep all follow-up visits as told by your health care provider. This is important. SEEK MEDICAL CARE IF:  You have nausea, vomiting, or abdominal discomfort.  You have a fever. SEEK IMMEDIATE MEDICAL CARE IF:  You have severe abdominal pain or bloating.  You cannot eat or drink without vomiting.   This information is not intended to replace advice given  to you by your health care provider. Make sure you discuss any questions you have with your health care provider.   Document Released: 06/20/2003 Document Revised: 03/08/2015 Document Reviewed: 08/11/2014 Elsevier Interactive Patient Education [employer]

## 2017-11-06 ENCOUNTER — Encounter: Payer: Self-pay | Admitting: Emergency Medicine

## 2017-11-06 ENCOUNTER — Emergency Department
Admission: EM | Admit: 2017-11-06 | Discharge: 2017-11-06 | Disposition: A | Discharge status: Home / Self Care | Payer: Self-pay | Attending: Emergency Medicine | Admitting: Emergency Medicine

## 2017-11-06 DIAGNOSIS — Y929 Unspecified place or not applicable: Secondary | ICD-10-CM | POA: Insufficient documentation

## 2017-11-06 DIAGNOSIS — Y9389 Activity, other specified: Secondary | ICD-10-CM | POA: Insufficient documentation

## 2017-11-06 DIAGNOSIS — T22111A Burn of first degree of right forearm, initial encounter: Secondary | ICD-10-CM

## 2017-11-06 DIAGNOSIS — Y999 Unspecified external cause status: Secondary | ICD-10-CM | POA: Insufficient documentation

## 2017-11-06 DIAGNOSIS — T22211A Burn of second degree of right forearm, initial encounter: Secondary | ICD-10-CM | POA: Insufficient documentation

## 2017-11-06 DIAGNOSIS — X12XXXA Contact with other hot fluids, initial encounter: Secondary | ICD-10-CM | POA: Insufficient documentation

## 2017-11-06 DIAGNOSIS — T310 Burns involving less than 10% of body surface: Secondary | ICD-10-CM | POA: Insufficient documentation

## 2017-11-06 DIAGNOSIS — Z23 Encounter for immunization: Secondary | ICD-10-CM | POA: Insufficient documentation

## 2017-11-06 MED ORDER — MORPHINE SULFATE (PF) 4 MG/ML IV SOLN
4.0000 mg | Freq: Once | INTRAVENOUS | Status: AC
  Administered 2017-11-06: 4 mg via INTRAMUSCULAR
  Filled 2017-11-06: qty 1

## 2017-11-06 MED ORDER — TETANUS-DIPHTH-ACELL PERTUSSIS 5-2.5-18.5 LF-MCG/0.5 IM SUSP
INTRAMUSCULAR | Status: AC
  Filled 2017-11-06: qty 0.5

## 2017-11-06 MED ORDER — TRAMADOL HCL 50 MG PO TABS: 50.0000 mg | ORAL_TABLET | Freq: Four times a day (QID) | ORAL | 0 refills | Status: AC | PRN

## 2017-11-06 MED ORDER — TETANUS-DIPHTH-ACELL PERTUSSIS 5-2.5-18.5 LF-MCG/0.5 IM SUSP
0.5000 mL | Freq: Once | INTRAMUSCULAR | Status: AC
  Administered 2017-11-06: 0.5 mL via INTRAMUSCULAR

## 2017-11-06 MED ORDER — BACITRACIN ZINC 500 UNIT/GM EX OINT
TOPICAL_OINTMENT | Freq: Once | CUTANEOUS | Status: AC
  Administered 2017-11-06: 14:00:00 via TOPICAL
  Filled 2017-11-06: qty 2.7
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
## 2017-11-06 NOTE — Discharge Instructions (Signed)
Follow-up with your regular doctor for recheck in 2 to 3 days.  Continue to apply triple antibiotic ointment to the burn.  Keep the burn covered.  If you are worsening please return to the emergency department.  Been given a prescription for tramadol.  This is the strongest medication that would be approved by the [facility] for your burn.

## 2017-11-06 NOTE — ED Notes (Signed)
Bacitracin dressing to right arm after cleansed with ns.
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
